# Patient Record
Sex: Female | Born: 1990 | Race: White | Hispanic: No | Marital: Single | Smoking: Never smoker
Health system: Southern US, Community
[De-identification: ages and names within clinical notes are randomized; demographics above are authoritative.]

## PROBLEM LIST (undated history)

## (undated) ENCOUNTER — Emergency Department (HOSPITAL_COMMUNITY): Payer: No Typology Code available for payment source | Source: Home / Self Care

## (undated) DIAGNOSIS — N750 Cyst of Bartholin's gland: Secondary | ICD-10-CM

## (undated) DIAGNOSIS — G43909 Migraine, unspecified, not intractable, without status migrainosus: Secondary | ICD-10-CM

## (undated) DIAGNOSIS — D649 Anemia, unspecified: Secondary | ICD-10-CM

---
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
## 2009-08-28 ENCOUNTER — Emergency Department (HOSPITAL_COMMUNITY): Admission: EM | Admit: 2009-08-28 | Discharge: 2009-08-28 | Payer: Self-pay | Admitting: Emergency Medicine

## 2009-12-01 ENCOUNTER — Inpatient Hospital Stay (HOSPITAL_COMMUNITY): Admission: AD | Admit: 2009-12-01 | Discharge: 2009-12-01 | Payer: Self-pay | Admitting: Obstetrics & Gynecology

## 2009-12-22 DEATH — deceased

## 2009-12-31 ENCOUNTER — Inpatient Hospital Stay (HOSPITAL_COMMUNITY): Admission: AD | Admit: 2009-12-31 | Discharge: 2009-12-31 | Payer: Self-pay | Admitting: Obstetrics

## 2010-08-04 ENCOUNTER — Emergency Department (HOSPITAL_COMMUNITY): Admission: EM | Admit: 2010-08-04 | Discharge: 2010-08-04 | Payer: Self-pay | Admitting: Family Medicine

## 2010-10-14 ENCOUNTER — Encounter: Payer: Self-pay | Admitting: Obstetrics & Gynecology

## 2010-12-12 LAB — WET PREP, GENITAL
Clue Cells Wet Prep HPF POC: NONE SEEN
Trich, Wet Prep: NONE SEEN
Yeast Wet Prep HPF POC: NONE SEEN

## 2010-12-12 LAB — CBC
HCT: 31.2 % — ABNORMAL LOW (ref 36.0–46.0)
Hemoglobin: 10.5 g/dL — ABNORMAL LOW (ref 12.0–15.0)
MCHC: 33.6 g/dL (ref 30.0–36.0)
MCV: 93.1 fL (ref 78.0–100.0)
RDW: 12.8 % (ref 11.5–15.5)

## 2010-12-12 LAB — SAMPLE TO BLOOD BANK

## 2010-12-12 LAB — POCT PREGNANCY, URINE: Preg Test, Ur: NEGATIVE

## 2010-12-17 LAB — CBC
HCT: 36 % (ref 36.0–46.0)
Hemoglobin: 12 g/dL (ref 12.0–15.0)
Platelets: 193 10*3/uL (ref 150–400)
WBC: 9.7 10*3/uL (ref 4.0–10.5)

## 2010-12-17 LAB — URINALYSIS, ROUTINE W REFLEX MICROSCOPIC
Bilirubin Urine: NEGATIVE
Glucose, UA: NEGATIVE mg/dL
Protein, ur: NEGATIVE mg/dL
Urobilinogen, UA: 0.2 mg/dL (ref 0.0–1.0)

## 2010-12-17 LAB — URINE MICROSCOPIC-ADD ON

## 2010-12-25 LAB — POCT RAPID STREP A (OFFICE): Streptococcus, Group A Screen (Direct): POSITIVE — AB

## 2011-05-04 ENCOUNTER — Inpatient Hospital Stay (INDEPENDENT_AMBULATORY_CARE_PROVIDER_SITE_OTHER)
Admission: RE | Admit: 2011-05-04 | Discharge: 2011-05-04 | Disposition: A | Discharge status: Home / Self Care | Payer: Managed Care, Other (non HMO) | Source: Ambulatory Visit | Attending: Family Medicine | Admitting: Family Medicine

## 2011-05-04 DIAGNOSIS — L259 Unspecified contact dermatitis, unspecified cause: Secondary | ICD-10-CM

## 2011-10-23 ENCOUNTER — Emergency Department (INDEPENDENT_AMBULATORY_CARE_PROVIDER_SITE_OTHER)
Admission: EM | Admit: 2011-10-23 | Discharge: 2011-10-23 | Disposition: A | Discharge status: Home / Self Care | Payer: Managed Care, Other (non HMO) | Source: Home / Self Care | Attending: Family Medicine | Admitting: Family Medicine

## 2011-10-23 ENCOUNTER — Encounter (HOSPITAL_COMMUNITY): Payer: Self-pay | Admitting: *Deleted

## 2011-10-23 DIAGNOSIS — J45901 Unspecified asthma with (acute) exacerbation: Secondary | ICD-10-CM

## 2011-10-23 DIAGNOSIS — J069 Acute upper respiratory infection, unspecified: Secondary | ICD-10-CM

## 2011-10-23 HISTORY — DX: Anemia, unspecified: D64.9

## 2011-10-23 LAB — POCT RAPID STREP A: Streptococcus, Group A Screen (Direct): NEGATIVE

## 2011-10-23 MED ORDER — ALBUTEROL SULFATE HFA 108 (90 BASE) MCG/ACT IN AERS: 1.0000 | INHALATION_SPRAY | Freq: Four times a day (QID) | RESPIRATORY_TRACT | Status: DC | PRN

## 2011-10-23 MED ORDER — ACETAMINOPHEN-CODEINE 120-12 MG/5ML PO SUSP: 5.0000 mL | Freq: Four times a day (QID) | ORAL | Status: AC | PRN

## 2011-10-23 MED ORDER — PREDNISONE 20 MG PO TABS: ORAL_TABLET | ORAL | Status: AC

## 2011-10-23 NOTE — ED Notes (Signed)
Pt with onset of cough congestion sorethroat and body aches Sunday night - per pt had same symptoms one week ago felt better then onset Sunday

## 2011-10-23 NOTE — ED Provider Notes (Signed)
History     CSN: [id number]  Arrival date & time 10/23/11  1814   First MD Initiated Contact with Patient 10/23/11 1919      Chief Complaint  Patient presents with  . Cough  . Nasal Congestion  . Sore Throat  . Generalized Body Aches    (Consider location/radiation/quality/duration/timing/severity/associated sxs/prior treatment) HPI Comments: 21 y/o female h/o asthma here c/o sore throat, cough non productive and nasal congestion also associated with body aches for 4 days. Subjective fever 2 days ago. Starting to feel better, no rash, no chest pain, shortness of breath or abdominal pain, no nausea vomiting or diarrhea. Has had wheezing with coughing episodes. Ran out of albuterol. Had cold symptoms 2 weeks ago that resolved for almost 1 week before starting again 4 days ago.    Past Medical History  Diagnosis Date  . Asthma   . Anemia     History reviewed. No pertinent past surgical history.  History reviewed. No pertinent family history.  History  Substance Use Topics  . Smoking status: Never Smoker   . Smokeless tobacco: Not on file  . Alcohol Use: Yes    OB History    Grav Para Term Preterm Abortions TAB SAB Ect Mult Living                  Review of Systems  Constitutional: Negative for fever and appetite change.  HENT: Positive for congestion, sore throat and rhinorrhea. Negative for trouble swallowing, neck pain and sinus pressure.   Respiratory: Positive for cough and wheezing. Negative for shortness of breath.   Cardiovascular: Negative for chest pain.  Gastrointestinal: Negative for nausea, vomiting, abdominal pain and diarrhea.  Musculoskeletal: Positive for myalgias.  Skin: Negative for rash.  Neurological: Negative for headaches.    Allergies  Review of patient's allergies indicates no known allergies.  Home Medications   Current Outpatient Rx  Name Route Sig Dispense Refill  . DEXTROMETHORPHAN POLISTIREX ER 30 MG/5ML PO LQCR Oral Take 60 mg  by mouth as needed.    . GUAIFENESIN-DM 100-10 MG/5ML PO SYRP Oral Take 5 mLs by mouth 3 (three) times daily as needed.    . ACETAMINOPHEN-CODEINE 120-12 MG/5ML PO SUSP Oral Take 5 mLs by mouth every 6 (six) hours as needed for pain. 60 mL 0  . ALBUTEROL SULFATE HFA 108 (90 BASE) MCG/ACT IN AERS Inhalation Inhale 1-2 puffs into the lungs every 6 (six) hours as needed for wheezing. 1 Inhaler 0  . PREDNISONE 20 MG PO TABS  2 tabs po daily for 5 days 10 tablet no    BP 111/77  Pulse 75  Temp(Src) 98.7 F (37.1 C) (Oral)  Resp 16  SpO2 99%  LMP 09/22/2011  Physical Exam  Nursing note and vitals reviewed. Constitutional: She is oriented to person, place, and time. She appears well-developed and well-nourished. No distress.  HENT:  Head: Normocephalic and atraumatic.       Nasal Congestion with erythema and swelling of nasal turbinates, clear rhinorrhea. Mild pharyngeal erythema no exudates. No uvula deviation. No trismus. TM's clear fluid behind, no swelling redness or bulging.   Cardiovascular: Normal rate, regular rhythm and normal heart sounds.   Pulmonary/Chest: No respiratory distress. She has no wheezes. She has no rales. She exhibits no tenderness.       Mild prolonged expiration with sporadic rhonchi bilaterally, no active wheezing. No retractions, orthopnea or tachypnea.  Lymphadenopathy:    She has no cervical adenopathy.  Neurological: She is  alert and oriented to person, place, and time.  Skin: No rash noted.    ED Course  Procedures (including critical care time)   Labs Reviewed  POCT RAPID STREP A ([facility] CARE ONLY)  LAB REPORT - SCANNED   No results found.   1. URI (upper respiratory infection)   2. Asthma exacerbation       MDM  Negative rapid strept test. Treated with prednisone, albuterol, codeine will also take Claritin-D as needed.        Sharin Grave, MD 10/24/11 1515

## 2012-02-25 ENCOUNTER — Encounter (HOSPITAL_COMMUNITY): Payer: Self-pay | Admitting: Emergency Medicine

## 2012-02-25 ENCOUNTER — Emergency Department (INDEPENDENT_AMBULATORY_CARE_PROVIDER_SITE_OTHER)
Admission: EM | Admit: 2012-02-25 | Discharge: 2012-02-25 | Disposition: A | Discharge status: Home / Self Care | Payer: Managed Care, Other (non HMO) | Source: Home / Self Care | Attending: Emergency Medicine | Admitting: Emergency Medicine

## 2012-02-25 DIAGNOSIS — H60399 Other infective otitis externa, unspecified ear: Secondary | ICD-10-CM

## 2012-02-25 DIAGNOSIS — H6090 Unspecified otitis externa, unspecified ear: Secondary | ICD-10-CM

## 2012-02-25 MED ORDER — CEPHALEXIN 500 MG PO CAPS: 500.0000 mg | ORAL_CAPSULE | Freq: Three times a day (TID) | ORAL | Status: AC

## 2012-02-25 NOTE — Discharge Instructions (Signed)
Otitis Externa  Otitis externa ("swimmer's ear") is a germ (bacterial) or fungal infection of the outer ear canal (from the eardrum to the outside of the ear). Swimming in dirty water may cause swimmer's ear. It also may be caused by moisture in the ear from water remaining after swimming or bathing. Often the first signs of infection may be itching in the ear canal. This may progress to ear canal swelling, redness, and pus drainage, which may be signs of infection.  HOME CARE INSTRUCTIONS    Apply the antibiotic drops to the ear canal as prescribed by your doctor.   This can be a very painful medical condition. A strong pain reliever may be prescribed.   Only take over-the-counter or prescription medicines for pain, discomfort, or fever as directed by your caregiver.   If your caregiver has given you a follow-up appointment, it is very important to keep that appointment. Not keeping the appointment could result in a chronic or permanent injury, pain, hearing loss and disability. If there is any problem keeping the appointment, you must call back to this facility for assistance.  PREVENTION    It is important to keep your ear dry. Use the corner of a towel to wick water out of the ear canal after swimming or bathing.   Avoid scratching in your ear. This can damage the ear canal or remove the protective wax lining the canal and make it easier for germs (bacteria) or a fungus to grow.   You may use ear drops made of rubbing alcohol and vinegar after swimming to prevent future "swimmer's ear" infections. Make up a small bottle of equal parts white vinegar and alcohol. Put 3 or 4 drops into each ear after swimming.   Avoid swimming in lakes, polluted water, or poorly chlorinated pools.  SEEK MEDICAL CARE IF:    An oral temperature above 102 F (38.9 C) develops.   Your ear is still painful after 3 days and shows signs of getting worse (redness, swelling, pain, or pus).  MAKE SURE YOU:    Understand these  instructions.   Will watch your condition.   Will get help right away if you are not doing well or get worse.  Document Released: 09/09/2005 Document Revised: 08/29/2011 Document Reviewed: 04/15/2008  [employer] Patient Information 2012 [employer].

## 2012-02-25 NOTE — ED Notes (Signed)
C/o right earache and headache.  Headache started Friday.  Headache went away yesterday and came back today.  C/o facial pain tenderness.  Ear pain started last night and has worsened today

## 2012-02-25 NOTE — ED Provider Notes (Signed)
Chief Complaint  Patient presents with  . Otalgia    History of Present Illness:   Mindy Rios is a 21 year old female who has had a five-day history of right external ear pain. She's also had some headache, congestion, and rhinorrhea. She denies any fever, chills, sore throat, or cough. The pain is located on the external ear there is some redness and swelling and is tender to touch. She denies any drainage from the ear.  Review of Systems:  Other than noted above, the patient denies any of the following symptoms. Systemic:  No fever, chills, sweats, fatigue, myalgias, headache, or anorexia. Eye:  No redness, pain or drainage. ENT:  No earache, ear congestion, nasal congestion, sneezing, rhinorrhea, sinus pressure, sinus pain, post nasal drip, or sore throat. Lungs:  No cough, sputum production, wheezing, shortness of breath, or chest pain. GI:  No abdominal pain, nausea, vomiting, or diarrhea. Skin:  No rash or itching.  PMFSH:  Past medical history, family history, social history, meds, and allergies were reviewed.  Physical Exam:   Vital signs:  BP 102/70  Pulse 62  Temp(Src) 98.9 F (37.2 C) (Oral)  Resp 18  SpO2 99%  LMP 02/18/2012 General:  Alert, in no distress. Eye:  No conjunctival injection or drainage. Lids were normal. ENT:  TMs and canals were normal, without erythema or inflammation. She had 2 areas of erythema on the external ear, one on the pinna and one on the helix of the ear. These were tender to touch. The remainder the external ear appeared normal in the canal and TM are unremarkable. Nasal mucosa was clear and uncongested, without drainage.  Mucous membranes were moist.  Pharynx was clear, without exudate or drainage.  There were no oral ulcerations or lesions. Neck:  Supple, no adenopathy, tenderness or mass. Lungs:  No respiratory distress.  Lungs were clear to auscultation, without wheezes, rales or rhonchi.  Breath sounds were clear and equal bilaterally. Lungs were  resonant to percussion.  No egophony. Heart:  Regular rhythm, without gallops, murmers or rubs. Skin:  Clear, warm, and dry, without rash or lesions.  Assessment:  The encounter diagnosis was Otitis externa. This appears to be a skin infection of the external ear. Additionally, this may be the cause for the headache as well.  Plan:   1.  The following meds were prescribed:   New Prescriptions   CEPHALEXIN (KEFLEX) 500 MG CAPSULE    Take 1 capsule (500 mg total) by mouth 3 (three) times daily.   2.  The patient was instructed in symptomatic care and handouts were given. 3.  The patient was told to return if becoming worse in any way, if no better in 3 or 4 days, and given some red flag symptoms that would indicate earlier return.   Reuben Likes, MD 02/25/12 2139

## 2012-09-04 ENCOUNTER — Encounter (HOSPITAL_COMMUNITY): Payer: Self-pay | Admitting: Emergency Medicine

## 2012-09-04 ENCOUNTER — Emergency Department (HOSPITAL_COMMUNITY)
Admission: EM | Admit: 2012-09-04 | Discharge: 2012-09-05 | Disposition: A | Discharge status: Home / Self Care | Payer: Managed Care, Other (non HMO) | Attending: Emergency Medicine | Admitting: Emergency Medicine

## 2012-09-04 ENCOUNTER — Emergency Department (HOSPITAL_COMMUNITY): Payer: Managed Care, Other (non HMO)

## 2012-09-04 DIAGNOSIS — J029 Acute pharyngitis, unspecified: Secondary | ICD-10-CM | POA: Insufficient documentation

## 2012-09-04 DIAGNOSIS — Z862 Personal history of diseases of the blood and blood-forming organs and certain disorders involving the immune mechanism: Secondary | ICD-10-CM | POA: Insufficient documentation

## 2012-09-04 DIAGNOSIS — R05 Cough: Secondary | ICD-10-CM | POA: Insufficient documentation

## 2012-09-04 DIAGNOSIS — J40 Bronchitis, not specified as acute or chronic: Secondary | ICD-10-CM | POA: Insufficient documentation

## 2012-09-04 DIAGNOSIS — G43909 Migraine, unspecified, not intractable, without status migrainosus: Secondary | ICD-10-CM | POA: Insufficient documentation

## 2012-09-04 DIAGNOSIS — R059 Cough, unspecified: Secondary | ICD-10-CM | POA: Insufficient documentation

## 2012-09-04 DIAGNOSIS — R071 Chest pain on breathing: Secondary | ICD-10-CM | POA: Insufficient documentation

## 2012-09-04 DIAGNOSIS — J45909 Unspecified asthma, uncomplicated: Secondary | ICD-10-CM | POA: Insufficient documentation

## 2012-09-04 MED ORDER — KETOROLAC TROMETHAMINE 60 MG/2ML IM SOLN
60.0000 mg | Freq: Once | INTRAMUSCULAR | Status: AC
  Administered 2012-09-04: 60 mg via INTRAMUSCULAR
  Filled 2012-09-04: qty 2

## 2012-09-04 MED ORDER — PREDNISONE 20 MG PO TABS
60.0000 mg | ORAL_TABLET | Freq: Once | ORAL | Status: AC
  Administered 2012-09-04: 60 mg via ORAL
  Filled 2012-09-04: qty 3
  Filled 2012-09-04: qty 1

## 2012-09-04 MED ORDER — ALBUTEROL SULFATE (5 MG/ML) 0.5% IN NEBU
5.0000 mg | INHALATION_SOLUTION | Freq: Once | RESPIRATORY_TRACT | Status: AC
  Administered 2012-09-04: 5 mg via RESPIRATORY_TRACT
  Filled 2012-09-04 (×2): qty 1

## 2012-09-04 MED ORDER — IPRATROPIUM BROMIDE 0.02 % IN SOLN
0.5000 mg | Freq: Once | RESPIRATORY_TRACT | Status: AC
  Administered 2012-09-04: 0.5 mg via RESPIRATORY_TRACT
  Filled 2012-09-04 (×2): qty 2.5

## 2012-09-04 NOTE — ED Notes (Signed)
Called pt in waiting room with  No response

## 2012-09-04 NOTE — ED Notes (Addendum)
Pt c/o SOB starting today; pt sounds hoarse at present; pt sts pain with cough and inspiration

## 2012-09-04 NOTE — ED Notes (Signed)
PT states no relief from breathing tx she received in triage.

## 2012-09-04 NOTE — ED Provider Notes (Signed)
History     CSN: [id number]  Arrival date & time 09/04/12  4098   First MD Initiated Contact with Patient 09/04/12 2301      Chief Complaint  Patient presents with  . Shortness of Breath    (Consider location/radiation/quality/duration/timing/severity/associated sxs/prior treatment) HPI History provided by pt.   Pt woke this morning w/ sore throat and cough.  At approx 11am, she had gradual onset non-exertional SOB and mildly pleuritic mid-line chest pain.  Has spasm-like pain in left mid-back as well.  H/o asthma.  Had a breathing treatment in triage which did not relieve her sx and she developed a typical frontal migraine headache shortly after.  Associated w/ photophobia; denies dizziness, blurred vision, N/V.  No recent head trauma.  Denies fever, nasal congestion, rhinorrhea, N/V/D and urinary sx.  Mother being treated for bronchitis currently.  No RF for PE.  Past Medical History  Diagnosis Date  . Asthma   . Anemia     History reviewed. No pertinent past surgical history.  History reviewed. No pertinent family history.  History  Substance Use Topics  . Smoking status: Never Smoker   . Smokeless tobacco: Not on file  . Alcohol Use: Yes    OB History    Grav Para Term Preterm Abortions TAB SAB Ect Mult Living                  Review of Systems  All other systems reviewed and are negative.    Allergies  Review of patient's allergies indicates no known allergies.  Home Medications  No current outpatient prescriptions on file.  BP 142/75  Pulse 88  Temp 98.6 F (37 C) (Oral)  SpO2 100%  LMP 08/07/2012  Physical Exam  Nursing note and vitals reviewed. Constitutional: She is oriented to person, place, and time. She appears well-developed and well-nourished. No distress.  HENT:  Head: Normocephalic and atraumatic.       Mild erythema posterior pharynx.  No tonsillar edema or exudate.  Mild, bilateral frontal and maxillary sinus ttp.  Nml TMs and external  auditory canals.   Eyes:       Normal appearance  Neck: Normal range of motion.       No meningeal signs  Cardiovascular: Normal rate, regular rhythm and intact distal pulses.   Pulmonary/Chest: Effort normal and breath sounds normal.       Diffuse anterior chest tenderness w/ guarding.  No pleuritic pain reported.   Abdominal: Soft. Bowel sounds are normal. She exhibits no distension and no mass. There is no rebound and no guarding.       Mild LUQ ttp.    Genitourinary:       Mild L CVA ttp  Musculoskeletal: Normal range of motion.       No LE edema or ttp.  Mild tenderness left mid-back, including but less prominent at CVA .   Lymphadenopathy:    She has no cervical adenopathy.  Neurological: She is alert and oriented to person, place, and time. No sensory deficit. Coordination normal.       CN 3-12 intact.  No nystagmus.  5/5 and equal upper and lower extremity strength.  No past pointing.    Skin: Skin is warm and dry. No rash noted.  Psychiatric: She has a normal mood and affect. Her behavior is normal.    ED Course  Procedures (including critical care time)   Date: 09/04/2012  Rate: 97  Rhythm: normal sinus rhythm  QRS Axis:  normal  Intervals: normal  ST/T Wave abnormalities: normal  Conduction Disutrbances:none  Narrative Interpretation:   Old EKG Reviewed: none available   Labs Reviewed - No data to display Dg Chest 2 View  09/04/2012  *RADIOLOGY REPORT*  Clinical Data: Shortness of breath, mid chest pain radiating to back, dry cough, history asthma  CHEST - 2 VIEW  Comparison: None  Findings: Normal heart size, mediastinal contours, and pulmonary vascularity. Lungs clear. Bones unremarkable. No pneumothorax.  IMPRESSION: Normal exam.   Original Report Authenticated By: Ulyses Southward, M.D.      1. Bronchitis   2. Migraine       MDM  21yo F w/ h/o asthma and migraines presents w/ c/o cough, non-exertional CP/SOB, sore throat and migraine.  Attributes migraine to  breathing treatment administered in triage and characteristics of pain are typical.  On exam, afebrile, VS w/in nml range,  no respiratory distress, nml breath sounds, unremarkable ENT, no signs of DVT, no focal neuro deficits or meningeal signs.  CXR neg.  EKG pending.  Pt to receive IM toradol for headache and chest/throat pain as well as single dose of prednisone.  11:25 PM   EKG non-ischemic/NSR.  Pt reports improvement in headache and throat/CP.  She was ambulated by nursing staff and did not become symptomatic nor drop her sats.  D/c'd home w/ guaifenesin-codeine for symptomatic treatment of bronchitis.  She has h/o allergic rxn to unknown bronchodilator.  Return precautions discussed. 12:37 AM        Otilio Miu, PA-C 09/05/12 [phone]

## 2012-09-05 MED ORDER — GUAIFENESIN-CODEINE 100-10 MG/5ML PO SYRP: 5.0000 mL | ORAL_SOLUTION | Freq: Three times a day (TID) | ORAL | Status: DC | PRN

## 2012-09-05 NOTE — ED Provider Notes (Signed)
Medical screening examination/treatment/procedure(s) were performed by non-physician practitioner and as supervising physician I was immediately available for consultation/collaboration.    Vida Roller, MD 09/05/12 [phone]

## 2012-09-05 NOTE — ED Notes (Signed)
Pt was able to ambulate and maintain sats of 98% on room air.

## 2013-05-06 ENCOUNTER — Encounter (HOSPITAL_COMMUNITY): Payer: Self-pay | Admitting: Emergency Medicine

## 2013-05-06 ENCOUNTER — Emergency Department (HOSPITAL_COMMUNITY)
Admission: EM | Admit: 2013-05-06 | Discharge: 2013-05-06 | Disposition: A | Discharge status: Home / Self Care | Payer: Self-pay | Attending: Emergency Medicine | Admitting: Emergency Medicine

## 2013-05-06 DIAGNOSIS — H1030 Unspecified acute conjunctivitis, unspecified eye: Secondary | ICD-10-CM | POA: Insufficient documentation

## 2013-05-06 DIAGNOSIS — H109 Unspecified conjunctivitis: Secondary | ICD-10-CM

## 2013-05-06 DIAGNOSIS — J45909 Unspecified asthma, uncomplicated: Secondary | ICD-10-CM | POA: Insufficient documentation

## 2013-05-06 MED ORDER — ERYTHROMYCIN 5 MG/GM OP OINT: TOPICAL_OINTMENT | OPHTHALMIC | Status: DC

## 2013-05-06 NOTE — ED Provider Notes (Signed)
CSN: [id number]     Arrival date & time 05/06/13  4098 History     First MD Initiated Contact with Patient 05/06/13 [phone]     Chief Complaint  Patient presents with  . Eye Pain   (Consider location/radiation/quality/duration/timing/severity/associated sxs/prior Treatment) HPI Comments: Patient is a 22 year old female who presents today with injection of her right conjunctiva last night. She states that her boyfriend was recently diagnosed with conjunctivitis. Her right eye is itchy and she noted purulent discharge from her eye. She states that she had to use a warm wash cloth to wipe her eye off to open her eyes. She reports no other symptoms including fever, chills, nausea, vomiting, cough, cold, congestion, photophobia, double vision, blurry vision. She wears glasses, but not contacts.  The history is provided by the patient. No language interpreter was used.    Past Medical History  Diagnosis Date  . Asthma   . Anemia    History reviewed. No pertinent past surgical history. No family history on file. History  Substance Use Topics  . Smoking status: Never Smoker   . Smokeless tobacco: Not on file  . Alcohol Use: Yes   OB History   Grav Para Term Preterm Abortions TAB SAB Ect Mult Living                 Review of Systems  Constitutional: Negative for fever and chills.  HENT: Negative for congestion, sore throat and rhinorrhea.   Eyes: Positive for discharge, redness and itching. Negative for photophobia, pain and visual disturbance.  Respiratory: Negative for cough and shortness of breath.   Cardiovascular: Negative for chest pain.  All other systems reviewed and are negative.    Allergies  Review of patient's allergies indicates no known allergies.  Home Medications   Current Outpatient Rx  Name  Route  Sig  Dispense  Refill  . guaiFENesin-codeine (ROBITUSSIN AC) 100-10 MG/5ML syrup   Oral   Take 5 mLs by mouth 3 (three) times daily as needed for cough.   60  mL   0    BP 107/77  Pulse 66  Temp(Src) 98.4 F (36.9 C) (Oral)  Resp 18  Ht 5\' 5"  (1.651 m)  Wt 162 lb (73.483 kg)  BMI 26.96 kg/m2  SpO2 99%  LMP 04/15/2013 Physical Exam  Nursing note and vitals reviewed. Constitutional: She is oriented to person, place, and time. She appears well-developed and well-nourished. No distress.  HENT:  Head: Normocephalic and atraumatic.  Right Ear: External ear normal.  Left Ear: External ear normal.  Nose: Nose normal.  Mouth/Throat: Oropharynx is clear and moist.  Eyes: EOM are normal. Pupils are equal, round, and reactive to light. Right conjunctiva is injected. Right conjunctiva has no hemorrhage. Left conjunctiva is not injected. Left conjunctiva has no hemorrhage.  Mild injection of right conjunctiva  No entrapment or purulent discharge noted. Mild watery discharge. No photophobia or photophobia with consensual light reflex. No swelling.  Neck: Normal range of motion.  Cardiovascular: Normal rate, regular rhythm and normal heart sounds.   Pulmonary/Chest: Effort normal and breath sounds normal. No stridor. No respiratory distress. She has no wheezes. She has no rales.  Abdominal: Soft. She exhibits no distension.  Musculoskeletal: Normal range of motion.  Neurological: She is alert and oriented to person, place, and time. She has normal strength.  Skin: Skin is warm and dry. She is not diaphoretic. No erythema.  Psychiatric: She has a normal mood and affect. Her behavior is normal.  ED Course   Procedures (including critical care time)  Labs Reviewed - No data to display No results found. 1. Conjunctivitis     MDM  Patient presentation consistent with viral conjunctivitis.  No purulent discharge, corneal abrasions, entrapment, consensual photophobia.  Presentation non-concerning for iritis, bacterial conjunctivitis, corneal abrasions, or HSV. Patient was given rx for erythromycin ointment and told to only fill the rx if  symptoms did not improve within 48 hours.  Personal hygiene and frequent handwashing discussed.  Patient advised to return to ED if symptoms persist or worsen in any way including vision change or purulent discharge.  Patient verbalizes understanding and is agreeable with discharge.   Mora Bellman, PA-C 05/06/13 [phone]

## 2013-05-06 NOTE — ED Notes (Signed)
Patient states that her boyfriend was diagnosed with pink eye yesterday.  Patient states her eye itches, is red and has drainage.

## 2013-05-06 NOTE — ED Provider Notes (Signed)
Medical screening examination/treatment/procedure(s) were performed by non-physician practitioner and as supervising physician I was immediately available for consultation/collaboration.  Olivia Mackie, MD 05/06/13 [phone]

## 2013-06-04 IMAGING — CR DG CHEST 2V
2 series · 2 of 2 positions shown · non-contrast
Comparison: None

CLINICAL DATA: Shortness of breath, mid chest pain radiating to
back, dry cough, history asthma

CHEST - 2 VIEW

[w chest pa]
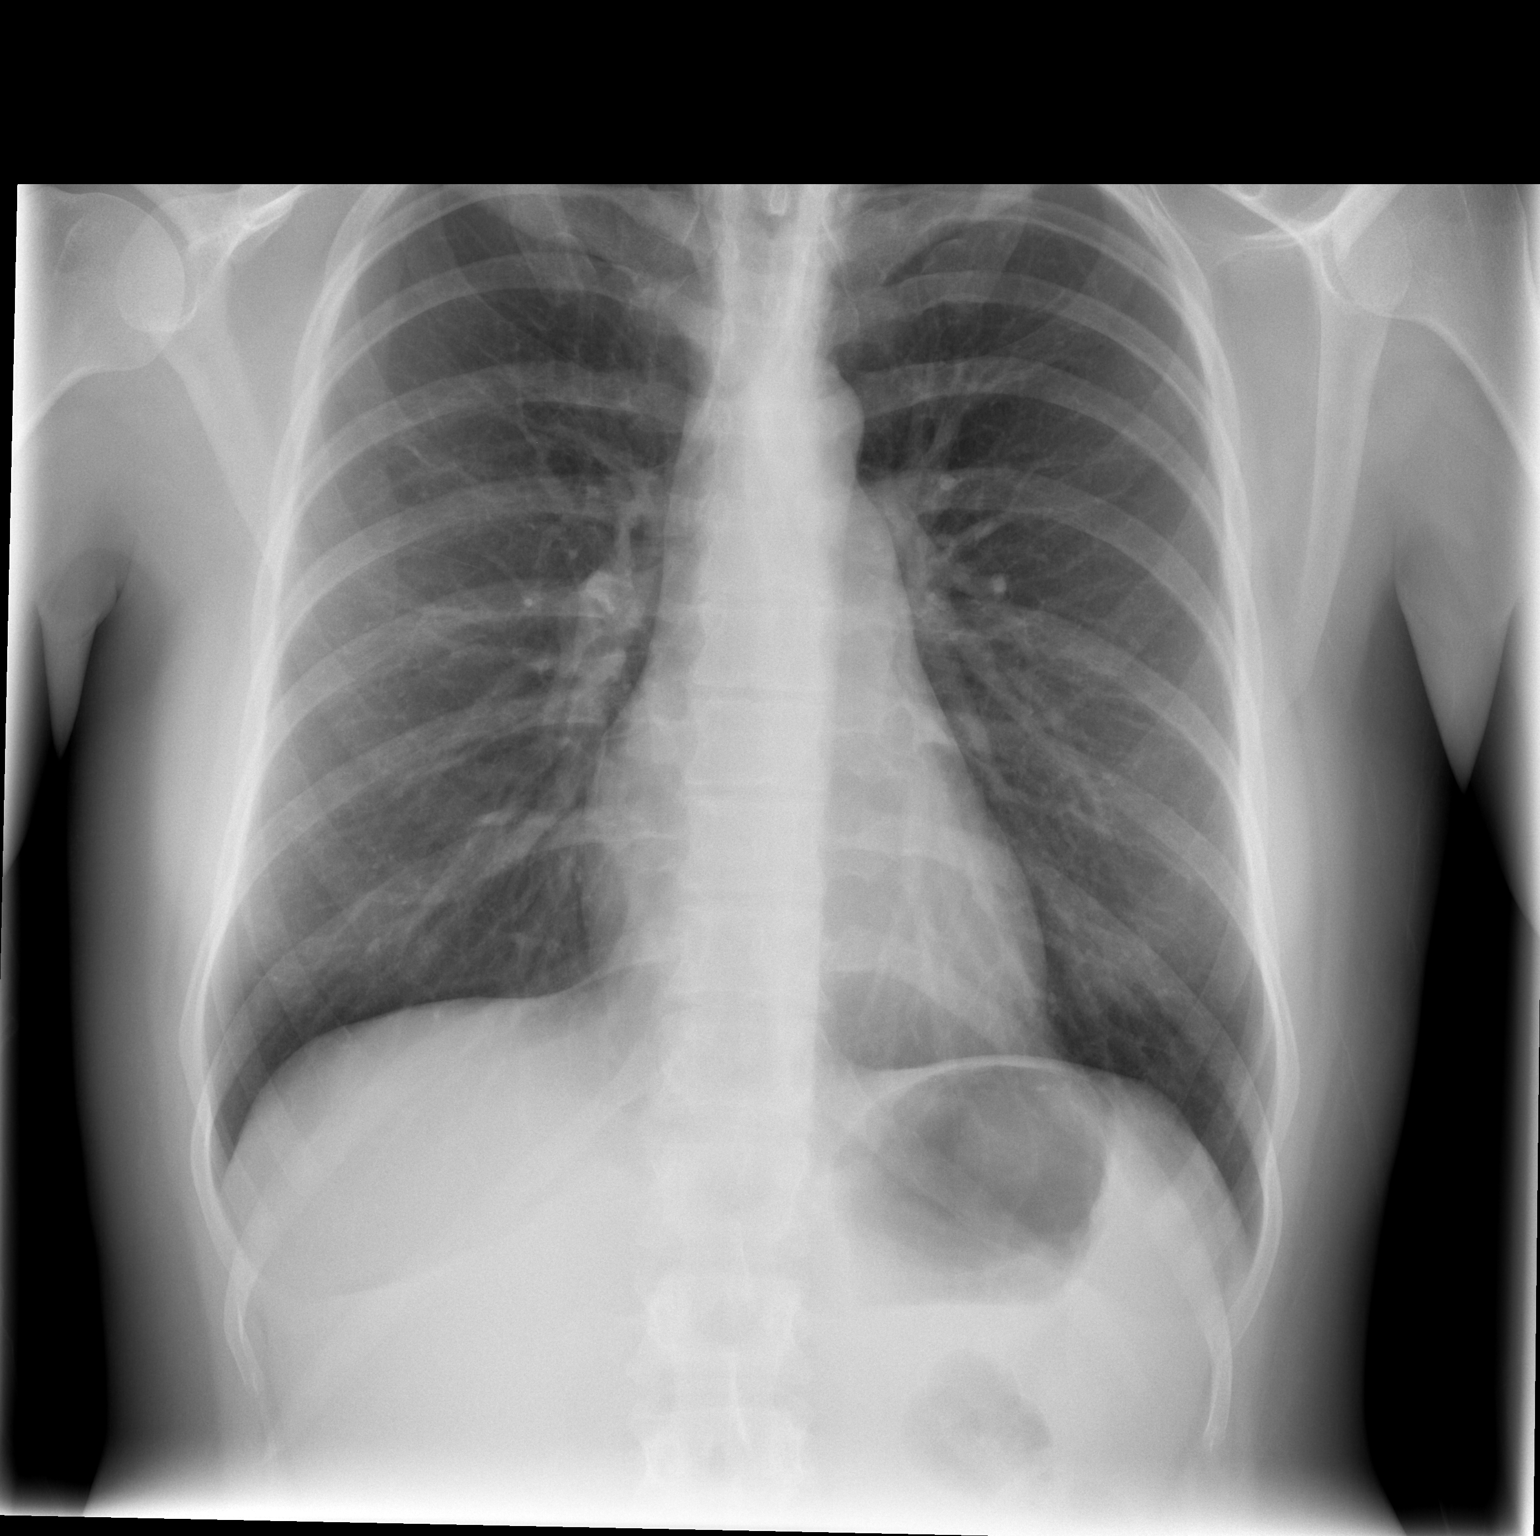

[w chest lat]
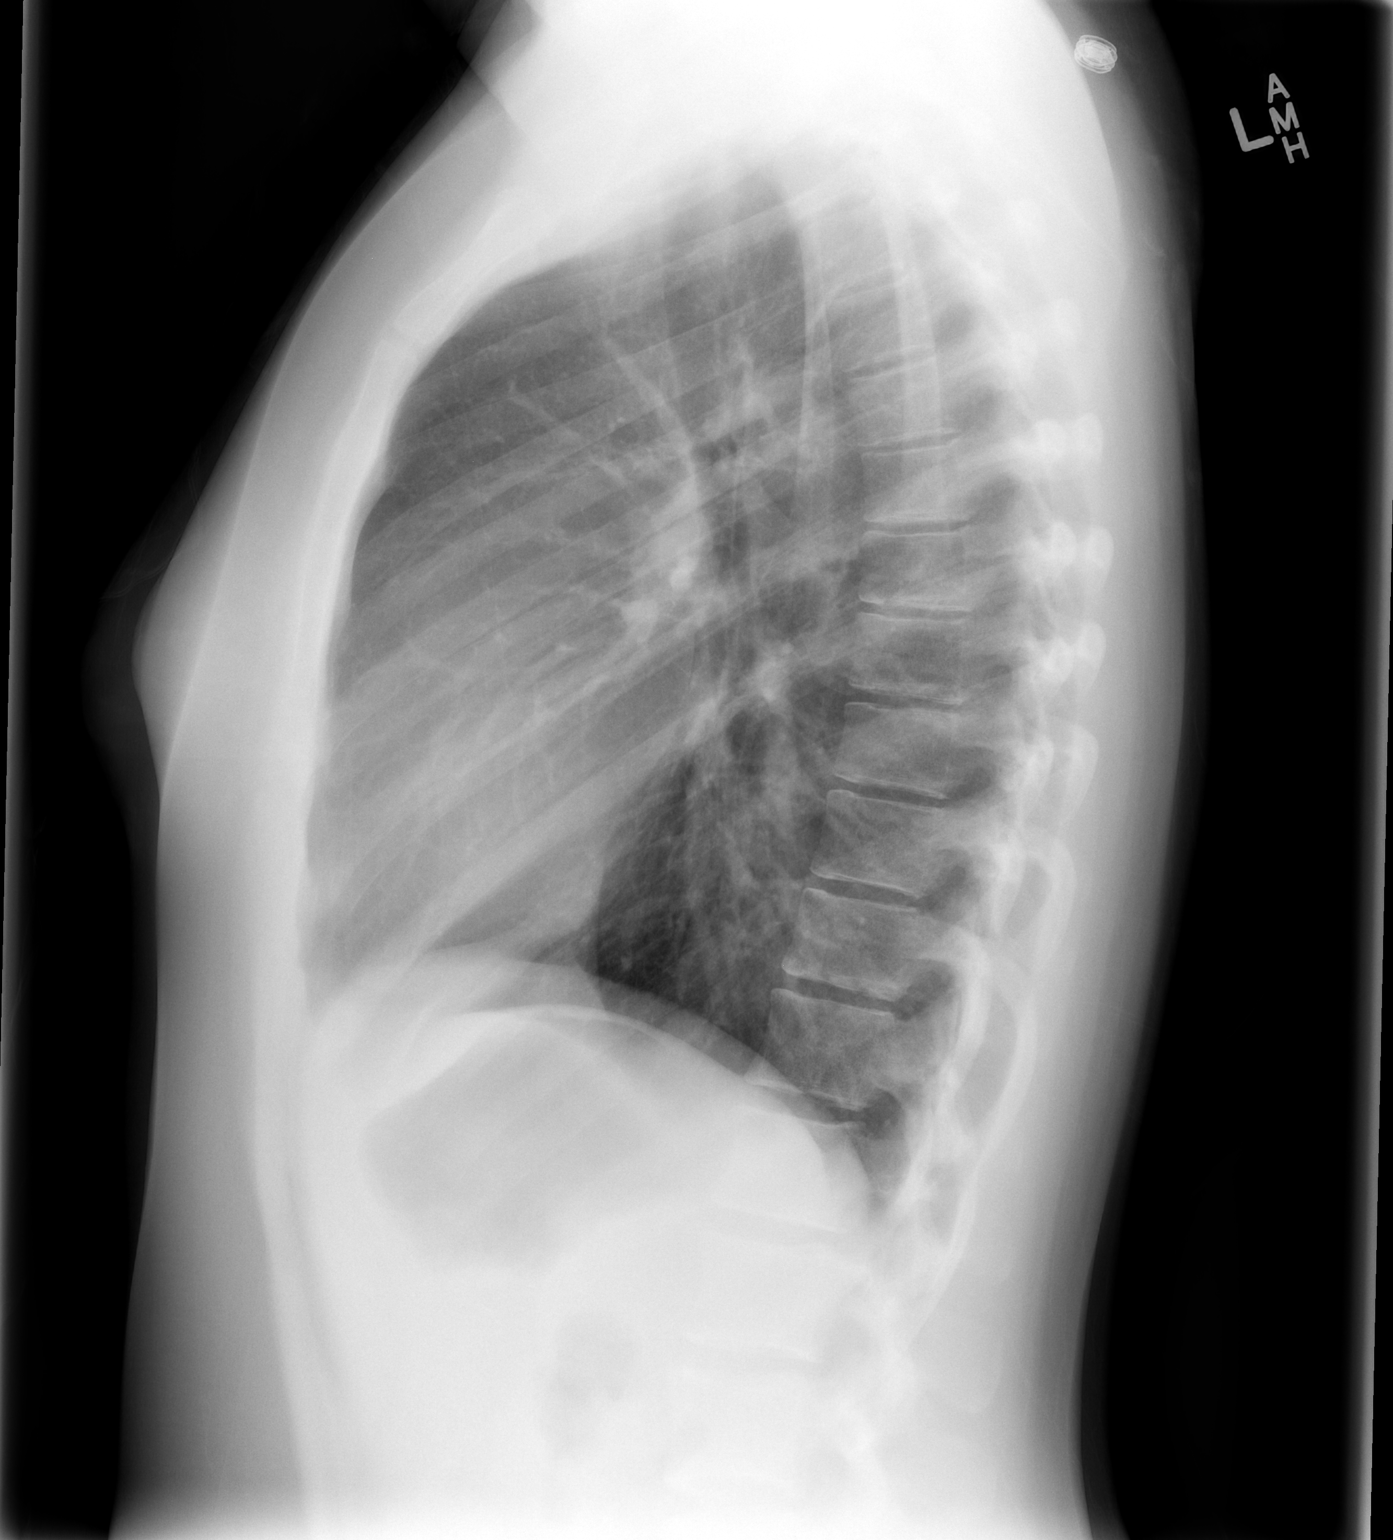

[2 of 2 positions shown; findings below may reference images not displayed]

FINDINGS: Normal heart size, mediastinal contours, and pulmonary vascularity.
Lungs clear.
Bones unremarkable.
No pneumothorax.
IMPRESSION: Normal exam.

## 2013-06-23 ENCOUNTER — Emergency Department (HOSPITAL_COMMUNITY)
Admission: EM | Admit: 2013-06-23 | Discharge: 2013-06-23 | Disposition: A | Discharge status: Home / Self Care | Payer: Managed Care, Other (non HMO) | Attending: Emergency Medicine | Admitting: Emergency Medicine

## 2013-06-23 ENCOUNTER — Encounter (HOSPITAL_COMMUNITY): Payer: Self-pay | Admitting: *Deleted

## 2013-06-23 DIAGNOSIS — J029 Acute pharyngitis, unspecified: Secondary | ICD-10-CM | POA: Insufficient documentation

## 2013-06-23 DIAGNOSIS — J45909 Unspecified asthma, uncomplicated: Secondary | ICD-10-CM | POA: Insufficient documentation

## 2013-06-23 DIAGNOSIS — Z79899 Other long term (current) drug therapy: Secondary | ICD-10-CM | POA: Insufficient documentation

## 2013-06-23 DIAGNOSIS — H9209 Otalgia, unspecified ear: Secondary | ICD-10-CM | POA: Insufficient documentation

## 2013-06-23 DIAGNOSIS — J069 Acute upper respiratory infection, unspecified: Secondary | ICD-10-CM

## 2013-06-23 DIAGNOSIS — G43909 Migraine, unspecified, not intractable, without status migrainosus: Secondary | ICD-10-CM | POA: Insufficient documentation

## 2013-06-23 HISTORY — DX: Migraine, unspecified, not intractable, without status migrainosus: G43.909

## 2013-06-23 MED ORDER — ACETAMINOPHEN 500 MG PO TABS
1000.0000 mg | ORAL_TABLET | Freq: Once | ORAL | Status: AC
  Administered 2013-06-23: 1000 mg via ORAL
  Filled 2013-06-23: qty 2

## 2013-06-23 NOTE — ED Notes (Addendum)
Reports history of migraines, has had this kind of migraine before. Has been taking excedrine migraine without relief. C/o non prod cough, bilateral ear ache no drainage & facial pain due to sinus pressure since yesterday. Denies fever, chills, n/v. Pt drove self here from work

## 2013-06-23 NOTE — ED Provider Notes (Signed)
CSN: [id number]     Arrival date & time 06/23/13  0803 History   First MD Initiated Contact with Patient 06/23/13 0830     Chief Complaint  Patient presents with  . Migraine  . Otalgia  . Sore Throat   (Consider location/radiation/quality/duration/timing/severity/associated sxs/prior Treatment) HPI Comments: 22 year old female presents with one week of waxing and waning headache consistent with her prior migraines. The headache is in the front and seems to come and go. She also now has 24 hours of upper respiratory symptoms. The symptoms include bilateral earache, nasal congestion, rhinorrhea, sneezing, and dry cough. She also has had a scratchy throat. She denies any neck pain or neck stiffness. Denies any fevers. She denies any dyspnea or chest pain. She's not had any photophobia, blurry vision, or nausea or vomiting. She has tried Excedrin at home, most recently last night, without much relief.  The history is provided by the patient.    Past Medical History  Diagnosis Date  . Asthma   . Anemia   . Migraine    History reviewed. No pertinent past surgical history. No family history on file. History  Substance Use Topics  . Smoking status: Never Smoker   . Smokeless tobacco: Not on file  . Alcohol Use: Yes   OB History   Grav Para Term Preterm Abortions TAB SAB Ect Mult Living                 Review of Systems  Constitutional: Negative for fever.  HENT: Positive for ear pain, congestion, sore throat, rhinorrhea and sneezing.   Eyes: Negative for photophobia and visual disturbance.  Respiratory: Positive for cough. Negative for shortness of breath.   Cardiovascular: Negative for chest pain.  Gastrointestinal: Negative for nausea and vomiting.  Neurological: Positive for headaches. Negative for weakness and numbness.  All other systems reviewed and are negative.    Allergies  Review of patient's allergies indicates no known allergies.  Home Medications   Current  Outpatient Rx  Name  Route  Sig  Dispense  Refill  . albuterol (PROVENTIL HFA;VENTOLIN HFA) 108 (90 BASE) MCG/ACT inhaler   Inhalation   Inhale 2 puffs into the lungs every 6 (six) hours as needed for wheezing.         Marland Kitchen aspirin-acetaminophen-caffeine (EXCEDRIN MIGRAINE) 250-250-65 MG per tablet   Oral   Take 2 tablets by mouth every 6 (six) hours as needed for pain.         . Multiple Vitamin (MULTIVITAMIN WITH MINERALS) TABS tablet   Oral   Take 1 tablet by mouth daily.          BP 117/80  Pulse 76  Temp(Src) 98.2 F (36.8 C) (Oral)  Resp 18  Ht 5\' 5"  (1.651 m)  Wt 152 lb (68.947 kg)  BMI 25.29 kg/m2  SpO2 100%  LMP 06/19/2013 Physical Exam  Nursing note and vitals reviewed. Constitutional: She is oriented to person, place, and time. She appears well-developed and well-nourished. No distress.  HENT:  Head: Normocephalic and atraumatic.  Right Ear: External ear normal.  Left Ear: External ear normal.  Nose: Nose normal.  Mouth/Throat: No oropharyngeal exudate.  Eyes: EOM are normal. Pupils are equal, round, and reactive to light. Right eye exhibits no discharge. Left eye exhibits no discharge.  Cardiovascular: Normal rate, regular rhythm and normal heart sounds.   Pulmonary/Chest: Effort normal and breath sounds normal. She has no wheezes. She has no rales.  Abdominal: Soft. There is no tenderness.  Neurological: She is alert and oriented to person, place, and time. She has normal strength. No cranial nerve deficit or sensory deficit. GCS eye subscore is 4. GCS verbal subscore is 5. GCS motor subscore is 6.  Skin: Skin is warm and dry.    ED Course  Procedures (including critical care time) Labs Review Labs Reviewed - No data to display Imaging Review No results found.  MDM   1. Migraine   2. Upper respiratory infection    Patient's headache appears to be benign. She has a normal neuro exam and this is similar to her many prior headaches. She also has  upper respiratory symptoms consistent with a viral URI. By [facility] criteria she's not need stress testing or treatment. I offered IV treatment for her headache but she prefers to take his Tylenol and treatment at home. I discussed return precautions which she verbalized understanding of.   Audree Camel, MD 06/23/13 [phone]

## 2013-06-23 NOTE — ED Notes (Signed)
C/o migraine x 1 week & sore throat, ear ache & nasal congestion since yesterday

## 2013-12-13 ENCOUNTER — Encounter (HOSPITAL_COMMUNITY): Payer: Self-pay | Admitting: Emergency Medicine

## 2013-12-13 ENCOUNTER — Emergency Department (INDEPENDENT_AMBULATORY_CARE_PROVIDER_SITE_OTHER)
Admission: EM | Admit: 2013-12-13 | Discharge: 2013-12-13 | Disposition: A | Discharge status: Home / Self Care | Payer: No Typology Code available for payment source | Source: Home / Self Care | Attending: Family Medicine | Admitting: Family Medicine

## 2013-12-13 DIAGNOSIS — R202 Paresthesia of skin: Secondary | ICD-10-CM

## 2013-12-13 DIAGNOSIS — R51 Headache: Secondary | ICD-10-CM

## 2013-12-13 NOTE — Discharge Instructions (Signed)
Please use tylenol or ibuprofen as directed on packaging for discomfort. exam does not suggest intracranial process, such as stroke. Suggested that you monitor symptoms closely at home and if symptoms become persistent, suddenly worse or severe, you should have yourself re-evaluated at nearest emergency room. If symptoms persist,  follow up with  PCP for further evaluation.

## 2013-12-13 NOTE — ED Provider Notes (Signed)
CSN: [id number]     Arrival date & time 12/13/13  [id number] History   First MD Initiated Contact with Patient 12/13/13 1011     Chief Complaint  Patient presents with  . Extremity Weakness   (Consider location/radiation/quality/duration/timing/severity/associated sxs/prior Treatment) HPI Comments: Patient states that while she was getting ready for work this morning she developed a "tingling" discomfort at her left upper extremity from left shoulder to left hand. Describes discomfort as a throbbing sensation that waxes and wanes. Denies any changes in strength, recent illness or recent injury. 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 Spends much of her day on phone while typing at keyboard.   Patient is a 23 y.o. female presenting with extremity weakness. The history is provided by the patient.  Extremity Weakness    Past Medical History  Diagnosis Date  . Asthma   . Anemia   . Migraine    History reviewed. No pertinent past surgical history. History reviewed. No pertinent family history. History  Substance Use Topics  . Smoking status: Never Smoker   . Smokeless tobacco: Not on file  . Alcohol Use: Yes   OB History   Grav Para Term Preterm Abortions TAB SAB Ect Mult Living                 Review of Systems  Musculoskeletal: Positive for extremity weakness.  All other systems reviewed and are negative.    Allergies  Review of patient's allergies indicates no known allergies.  Home Medications   Current Outpatient Rx  Name  Route  Sig  Dispense  Refill  . albuterol (PROVENTIL HFA;VENTOLIN HFA) 108 (90 BASE) MCG/ACT inhaler   Inhalation   Inhale 2 puffs into the lungs every 6 (six) hours as needed for wheezing.         Marland Kitchen. aspirin-acetaminophen-caffeine (EXCEDRIN MIGRAINE) 250-250-65 MG per tablet   Oral   Take 2 tablets by mouth every 6 (six) hours as needed for pain.         . Multiple Vitamin (MULTIVITAMIN WITH  MINERALS) TABS tablet   Oral   Take 1 tablet by mouth daily.          BP 118/70  Pulse 78  Temp(Src) 97.6 F (36.4 C) (Oral)  Resp 16  SpO2 97%  LMP 11/29/2013 Physical Exam  Nursing note and vitals reviewed. Constitutional: She is oriented to person, place, and time. She appears well-developed and well-nourished. No distress.  HENT:  Head: Normocephalic and atraumatic.  Right Ear: External ear normal.  Left Ear: External ear normal.  Nose: Nose normal.  Eyes: Conjunctivae and EOM are normal. Pupils are equal, round, and reactive to light.  Neck: Normal range of motion and full passive range of motion without pain. Neck supple. No spinous process tenderness and no muscular tenderness present.  Cardiovascular: Normal rate, regular rhythm and normal heart sounds.   Pulmonary/Chest: Effort normal and breath sounds normal.  Musculoskeletal: Normal range of motion. She exhibits no edema.       Left shoulder: Normal.       Left elbow: Normal.       Left wrist: Normal.       Cervical back: Normal.       Left hand: Normal.  Neurological: She is alert and oriented to person, place, and time. She has normal strength. No cranial nerve deficit or sensory deficit. She displays a negative Romberg sign. Coordination and gait normal. GCS eye  subscore is 4. GCS verbal subscore is 5. GCS motor subscore is 6.  Reflex Scores:      Tricep reflexes are 2+ on the right side and 2+ on the left side.      Bicep reflexes are 2+ on the right side and 2+ on the left side.      Brachioradialis reflexes are 2+ on the right side and 2+ on the left side. Skin: Skin is warm and dry. No rash noted. No erythema.  Psychiatric: She has a normal mood and affect. Her behavior is normal.    ED Course  Procedures (including critical care time) Labs Review Labs Reviewed - No data to display Imaging Review No results found.   MDM   1. Paresthesia of arm    Left arm paraesthesia: exam does not suggest  intracranial process or cervical radiculopathy. Suggested to patient that she monitor symptoms closely at home and if symptoms become persistent, suddenly worse or severe, she should have herself re-evaluated at her nearest emergency room. If symptoms persist, she should follow up with her PCP for further evaluation.   Jess Barters [place], [place] 12/13/13 1057

## 2013-12-13 NOTE — ED Notes (Signed)
Pt  Reports  Some  Numbness    -  Tingling  Of  The  l  Arm  For  About  4  Hrs  Ago  She  Reports      She  Woke  Up  With the  Symptoms        She  denys  Any  Injury  Hand  Grip  Is  Moderate          Speech is  Normal      Ambulated  To  Room with a  Steady fluid  Gait

## 2013-12-15 NOTE — ED Provider Notes (Signed)
Medical screening examination/treatment/procedure(s) were performed by a resident physician or non-physician practitioner and as the supervising physician I was immediately available for consultation/collaboration.  Wanona Stare, MD    Camia Dipinto S Benno Brensinger, MD 12/15/13 0746 

## 2015-01-11 ENCOUNTER — Encounter (HOSPITAL_COMMUNITY): Payer: Self-pay | Admitting: Emergency Medicine

## 2015-01-11 ENCOUNTER — Emergency Department (HOSPITAL_COMMUNITY)
Admission: EM | Admit: 2015-01-11 | Discharge: 2015-01-12 | Disposition: A | Discharge status: Home / Self Care | Payer: No Typology Code available for payment source | Attending: Emergency Medicine | Admitting: Emergency Medicine

## 2015-01-11 ENCOUNTER — Emergency Department (HOSPITAL_COMMUNITY): Payer: No Typology Code available for payment source

## 2015-01-11 DIAGNOSIS — J45901 Unspecified asthma with (acute) exacerbation: Secondary | ICD-10-CM | POA: Insufficient documentation

## 2015-01-11 DIAGNOSIS — Z862 Personal history of diseases of the blood and blood-forming organs and certain disorders involving the immune mechanism: Secondary | ICD-10-CM | POA: Insufficient documentation

## 2015-01-11 DIAGNOSIS — Z79899 Other long term (current) drug therapy: Secondary | ICD-10-CM | POA: Insufficient documentation

## 2015-01-11 DIAGNOSIS — G43909 Migraine, unspecified, not intractable, without status migrainosus: Secondary | ICD-10-CM | POA: Insufficient documentation

## 2015-01-11 DIAGNOSIS — J029 Acute pharyngitis, unspecified: Secondary | ICD-10-CM | POA: Insufficient documentation

## 2015-01-11 DIAGNOSIS — R0602 Shortness of breath: Secondary | ICD-10-CM

## 2015-01-11 LAB — CBC
HCT: 34.1 % — ABNORMAL LOW (ref 36.0–46.0)
Hemoglobin: 11.2 g/dL — ABNORMAL LOW (ref 12.0–15.0)
MCH: 29.9 pg (ref 26.0–34.0)
MCHC: 32.8 g/dL (ref 30.0–36.0)
MCV: 91.2 fL (ref 78.0–100.0)
PLATELETS: 227 10*3/uL (ref 150–400)
RBC: 3.74 MIL/uL — AB (ref 3.87–5.11)
RDW: 12.2 % (ref 11.5–15.5)
WBC: 9.1 10*3/uL (ref 4.0–10.5)

## 2015-01-11 LAB — BASIC METABOLIC PANEL
Anion gap: 7 (ref 5–15)
BUN: 11 mg/dL (ref 6–23)
CALCIUM: 8.9 mg/dL (ref 8.4–10.5)
CO2: 22 mmol/L (ref 19–32)
Chloride: 107 mmol/L (ref 96–112)
Creatinine, Ser: 0.73 mg/dL (ref 0.50–1.10)
GFR calc Af Amer: >90 mL/min (ref >90)
Glucose, Bld: 101 mg/dL — ABNORMAL HIGH (ref 70–99)
Potassium: 3.7 mmol/L (ref 3.5–5.1)
SODIUM: 136 mmol/L (ref 135–145)

## 2015-01-11 LAB — I-STAT TROPONIN, ED: Troponin i, poc: 0.01 ng/mL (ref 0.00–0.08)

## 2015-01-11 MED ORDER — ACETAMINOPHEN 500 MG PO TABS
1000.0000 mg | ORAL_TABLET | Freq: Once | ORAL | Status: AC
  Administered 2015-01-12: 1000 mg via ORAL
  Filled 2015-01-11: qty 2

## 2015-01-11 NOTE — ED Provider Notes (Signed)
CSN: [id number]     Arrival date & time 01/11/15  2214 History   First MD Initiated Contact with Patient 01/11/15 2319     Chief Complaint  Patient presents with  . Chest Pain     (Consider location/radiation/quality/duration/timing/severity/associated sxs/prior Treatment) HPI Comments: The pt is a 24 y/o female with hx of 1 day of chest heaviness, sore throat and sob when she talks - she has no f/c/n/v and no abd pain, back pain, dysuria, diarrhea.  Sx are mild and persistent.  No sick contacts, no chronic medical problems.   Patient is a 24 y.o. female presenting with chest pain. The history is provided by the patient.  Chest Pain   Past Medical History  Diagnosis Date  . Asthma   . Anemia   . Migraine    History reviewed. No pertinent past surgical history. No family history on file. History  Substance Use Topics  . Smoking status: Never Smoker   . Smokeless tobacco: Not on file  . Alcohol Use: Yes   OB History    No data available     Review of Systems  Cardiovascular: Positive for chest pain.  All other systems reviewed and are negative.     Allergies  Review of patient's allergies indicates no known allergies.  Home Medications   Prior to Admission medications   Medication Sig Start Date End Date Taking? Authorizing Provider  albuterol (PROVENTIL HFA;VENTOLIN HFA) 108 (90 BASE) MCG/ACT inhaler Inhale 2 puffs into the lungs every 6 (six) hours as needed for wheezing.   Yes Historical Provider, MD  aspirin-acetaminophen-caffeine (EXCEDRIN MIGRAINE) [phone] MG per tablet Take 2 tablets by mouth every 6 (six) hours as needed for pain.   Yes Historical Provider, MD   BP 107/71 mmHg  Pulse 77  Temp(Src) 98.1 F (36.7 C) (Oral)  Resp 18  Ht  (1.651 m)  Wt 162 lb (73.483 kg)  BMI 26.96 kg/m2  SpO2 97%  LMP 01/08/2015 Physical Exam  Constitutional: She appears well-developed and well-nourished. No distress.  HENT:  Head: Normocephalic and  atraumatic.  Mouth/Throat: Oropharynx is clear and moist. No oropharyngeal exudate.  Normocephalic, atraumatic, clear oropharynx without any swelling, exudate, asymmetry, hypertrophy or erythema.  Moist mucous membranes, clear nasal passages, normal tympanic membranes without any effusions, erythema or opacification.  Eyes: Conjunctivae and EOM are normal. Pupils are equal, round, and reactive to light. Right eye exhibits no discharge. Left eye exhibits no discharge. No scleral icterus.  Neck: Normal range of motion. Neck supple. No JVD present. No thyromegaly present.  Very supple neck, no lymphadenopathy, no stiffness, no meningismus. No thyromegaly, trachea is midline. There is no torticollis  Cardiovascular: Normal rate, regular rhythm, normal heart sounds and intact distal pulses.  Exam reveals no gallop and no friction rub.   No murmur heard. The patient has normal heart sounds, no murmurs rubs or gallops, no tachycardia, strong pulses at the radial arteries bilaterally, no JVD, normal capillary refill time less than 2 seconds.  Pulmonary/Chest: Effort normal and breath sounds normal. No respiratory distress. She has no wheezes. She has no rales.  Lungs sounds are clear to auscultation bilaterally, no wheezes rhonchi or rales, no increased work of breathing, speaks in full sentences, no cyanosis, no accessory muscle use.  Abdominal: Soft. Bowel sounds are normal. She exhibits no distension and no mass. There is no tenderness.  The abdomen is soft and nontender, there is no tenderness in the right lower quadrant, right upper quadrant, there  is no CVA tenderness, there is no guarding, no masses. There is normal bowel sounds. There are no peritoneal signs, the abdomen is nonsurgical  Musculoskeletal: Normal range of motion. She exhibits no edema or tenderness.  Lymphadenopathy:    She has no cervical adenopathy.  Neurological: She is alert. Coordination normal.  Skin: Skin is warm and dry. No  rash noted. No erythema.  Psychiatric: She has a normal mood and affect. Her behavior is normal.  Nursing note and vitals reviewed.   ED Course  Procedures (including critical care time) Labs Review Labs Reviewed  CBC - Abnormal; Notable for the following:    RBC 3.74 (*)    Hemoglobin 11.2 (*)    HCT 34.1 (*)    All other components within normal limits  BASIC METABOLIC PANEL - Abnormal; Notable for the following:    Glucose, Bld 101 (*)    All other components within normal limits  RAPID STREP SCREEN  CULTURE, GROUP A STREP  I-STAT TROPOININ, ED    Imaging Review Dg Chest 2 View  01/11/2015   CLINICAL DATA:  Chest pain  EXAM: CHEST  2 VIEW  COMPARISON:  09/04/2012  FINDINGS: Cardiomediastinal contours within normal range. No confluent airspace opacity, pleural effusion, pneumothorax. No acute osseous finding.  IMPRESSION: No radiographic evidence of active cardiopulmonary disease.   Electronically Signed   By: Jearld LeschAndrew  DelGaizo M.D.   On: 01/11/2015 23:09     EKG Interpretation   Date/Time:  Wednesday January 11 2015 22:18:24 EDT Ventricular Rate:  79 PR Interval:  148 QRS Duration: 86 QT Interval:  384 QTC Calculation: 440 R Axis:   82 Text Interpretation:  Normal sinus rhythm Normal ECG since last tracing no  significant change Confirmed by Sarahjane Matherly  MD, Xiana Carns ([id number]) on 01/11/2015  11:36:59 PM      MDM   Final diagnoses:  Sore throat  Shortness of breath    The patient has normal vital signs, normal x-ray, labs are unremarkable, strep test ordered, patient otherwise looks stable for discharge, not having any difficulty breathing, normal EKG, benign appearance.  Strep neg, stable for d/c.  Eber HongBrian Hutch Rhett, MD 01/12/15 [phone]

## 2015-01-11 NOTE — ED Notes (Signed)
Pt reports cp that she noticed when she woke up this am, denies cough. C/o sore throat and sob when she talks.

## 2015-01-12 LAB — RAPID STREP SCREEN (MED CTR MEBANE ONLY): Streptococcus, Group A Screen (Direct): NEGATIVE

## 2015-01-12 NOTE — Discharge Instructions (Signed)
Please call your doctor for a followup appointment within 24-48 hours. When you talk to your doctor please let them know that you were seen in the emergency department and have them acquire all of your records so that they can discuss the findings with you and formulate a treatment plan to fully care for your new and ongoing problems. ° °

## 2015-01-14 LAB — CULTURE, GROUP A STREP: STREP A CULTURE: NEGATIVE

## 2015-03-21 ENCOUNTER — Emergency Department (HOSPITAL_COMMUNITY)
Admission: EM | Admit: 2015-03-21 | Discharge: 2015-03-21 | Disposition: A | Discharge status: Home / Self Care | Payer: No Typology Code available for payment source | Attending: Emergency Medicine | Admitting: Emergency Medicine

## 2015-03-21 ENCOUNTER — Encounter (HOSPITAL_COMMUNITY): Payer: Self-pay | Admitting: Emergency Medicine

## 2015-03-21 DIAGNOSIS — G43909 Migraine, unspecified, not intractable, without status migrainosus: Secondary | ICD-10-CM | POA: Insufficient documentation

## 2015-03-21 DIAGNOSIS — Z79899 Other long term (current) drug therapy: Secondary | ICD-10-CM | POA: Insufficient documentation

## 2015-03-21 DIAGNOSIS — Z862 Personal history of diseases of the blood and blood-forming organs and certain disorders involving the immune mechanism: Secondary | ICD-10-CM | POA: Insufficient documentation

## 2015-03-21 DIAGNOSIS — R590 Localized enlarged lymph nodes: Secondary | ICD-10-CM | POA: Insufficient documentation

## 2015-03-21 DIAGNOSIS — J45909 Unspecified asthma, uncomplicated: Secondary | ICD-10-CM | POA: Insufficient documentation

## 2015-03-21 DIAGNOSIS — H6691 Otitis media, unspecified, right ear: Secondary | ICD-10-CM | POA: Insufficient documentation

## 2015-03-21 MED ORDER — AMOXICILLIN 500 MG PO CAPS: 500.0000 mg | ORAL_CAPSULE | Freq: Three times a day (TID) | ORAL | Status: DC

## 2015-03-21 MED ORDER — PSEUDOEPHEDRINE HCL 60 MG PO TABS: 60.0000 mg | ORAL_TABLET | Freq: Three times a day (TID) | ORAL | Status: DC | PRN

## 2015-03-21 NOTE — Discharge Instructions (Signed)
Return to the emergency room with worsening of symptoms, new symptoms or with symptoms that are concerning , especially fevers, stiff neck, worsening headache, nausea/vomiting, visual changes or slurred speech, chest pain, shortness of breath, cough with thick colored mucous or blood Drink plenty of fluids with electrolytes especially Gatorade. OTC cold medications such as mucinex, nyquil, dayquil are recommended. Chloraseptic for sore throat. Read below information and follow recommendations. Otitis Media Otitis media is redness, soreness, and inflammation of the middle ear. Otitis media may be caused by allergies or, most commonly, by infection. Often it occurs as a complication of the common cold. SIGNS AND SYMPTOMS Symptoms of otitis media may include:  Earache.  Fever.  Ringing in your ear.  Headache.  Leakage of fluid from the ear. DIAGNOSIS To diagnose otitis media, your health care provider will examine your ear with an otoscope. This is an instrument that allows your health care provider to see into your ear in order to examine your eardrum. Your health care provider also will ask you questions about your symptoms. TREATMENT  Typically, otitis media resolves on its own within 3-5 days. Your health care provider may prescribe medicine to ease your symptoms of pain. If otitis media does not resolve within 5 days or is recurrent, your health care provider may prescribe antibiotic medicines if he or she suspects that a bacterial infection is the cause. HOME CARE INSTRUCTIONS   If you were prescribed an antibiotic medicine, finish it all even if you start to feel better.  Take medicines only as directed by your health care provider.  Keep all follow-up visits as directed by your health care provider. SEEK MEDICAL CARE IF:  You have otitis media only in one ear, or bleeding from your nose, or both.  You notice a lump on your neck.  You are not getting better in 3-5 days.  You  feel worse instead of better. SEEK IMMEDIATE MEDICAL CARE IF:   You have pain that is not controlled with medicine.  You have swelling, redness, or pain around your ear or stiffness in your neck.  You notice that part of your face is paralyzed.  You notice that the bone behind your ear (mastoid) is tender when you touch it. MAKE SURE YOU:   Understand these instructions.  Will watch your condition.  Will get help right away if you are not doing well or get worse. Document Released: 06/14/2004 Document Revised: 01/24/2014 Document Reviewed: 04/06/2013 [facility]Exit[facility] Patient Information 2015 [place], [place]. This information is not intended to replace advice given to you by your health care provider. Make sure you discuss any questions you have with your health care provider.

## 2015-03-21 NOTE — ED Provider Notes (Signed)
CSN: [id number]     Arrival date & time 03/21/15  1731 History  This chart was scribed for non-physician practitioner, Oswaldo Conroy, PA-C working with Gilda Crease, MD by Placido Sou, ED scribe. This patient was seen in room TR05C/TR05C and the patient's care was started at 6:34 PM.      Chief Complaint  Patient presents with  . Facial Pain   The history is provided by the patient. No language interpreter was used.    HPI Comments: Mindy Rios is a 24 y.o. female who presents to the Emergency Department complaining of intermittent, moderate, right ear pain with onset 10 days ago and worsening symptoms beginning 2 days ago. Pt notes a radiation of pain to the right side of her face and describes the pain as a "throbbing". She further notes constant, mild, sinus congestion as an associated symptom. She notes taking a Aleve and Ibuprofen with no relief of symptoms. Pt notes swimming just before the onset of symptoms and further notes she has a history of childhood ear infections. Pt denies any recent use of antibiotics. She denies fever, chills, sore throat, cough, SOB, CP, nausea, vomiting, hearing impairment, or ear drainage.  Past Medical History  Diagnosis Date  . Asthma   . Anemia   . Migraine    History reviewed. No pertinent past surgical history. No family history on file. History  Substance Use Topics  . Smoking status: Never Smoker   . Smokeless tobacco: Not on file  . Alcohol Use: Yes   OB History    No data available     Review of Systems  Constitutional: Negative for fever and chills.  HENT: Positive for congestion, ear pain, postnasal drip and rhinorrhea. Negative for ear discharge and sore throat.   Respiratory: Negative for cough and shortness of breath.   Cardiovascular: Negative for chest pain.  Gastrointestinal: Negative for nausea and vomiting.  Musculoskeletal: Positive for myalgias and neck pain.      Allergies  Review of patient's  allergies indicates no known allergies.  Home Medications   Prior to Admission medications   Medication Sig Start Date End Date Taking? Authorizing Provider  albuterol (PROVENTIL HFA;VENTOLIN HFA) 108 (90 BASE) MCG/ACT inhaler Inhale 2 puffs into the lungs every 6 (six) hours as needed for wheezing.    Historical Provider, MD  amoxicillin (AMOXIL) 500 MG capsule Take 1 capsule (500 mg total) by mouth 3 (three) times daily. 03/21/15   Oswaldo Conroy, PA-C  aspirin-acetaminophen-caffeine (EXCEDRIN MIGRAINE) [phone] MG per tablet Take 2 tablets by mouth every 6 (six) hours as needed for pain.    Historical Provider, MD  pseudoephedrine (SUDAFED) 60 MG tablet Take 1 tablet (60 mg total) by mouth every 8 (eight) hours as needed for congestion. 03/21/15   Oswaldo Conroy, PA-C   BP 116/87 mmHg  Pulse 72  Temp(Src) 98.2 F (36.8 C) (Oral)  Resp 16  Ht  (1.651 m)  Wt 158 lb 1 oz (71.697 kg)  BMI 26.30 kg/m2  SpO2 100%  LMP 03/10/2015 Physical Exam  Constitutional: She appears well-developed and well-nourished. No distress.  HENT:  Head: Normocephalic and atraumatic.  Right Ear: No tenderness. No mastoid tenderness. Tympanic membrane is erythematous and bulging.  Left Ear: Tympanic membrane is retracted.  Mouth/Throat: Uvula is midline. No trismus in the jaw. No oropharyngeal exudate.  cervical lymphadenopathy; no facial swelling; oropharynx erythema without exudate; uvula midline with no trismus; no external right ear tenderness; no right mastoid tenderness; right  bulging tympanic membrane with erythema and puss; right TM intact; left TM retracted without erythema; left TM intact   Eyes: Conjunctivae and EOM are normal. Right eye exhibits no discharge. Left eye exhibits no discharge.  Neck: Normal range of motion. Neck supple.  No meningismus  Cardiovascular: Normal rate and regular rhythm.   Pulmonary/Chest: Effort normal and breath sounds normal. No respiratory distress. She has no  wheezes.  Abdominal: Soft. Bowel sounds are normal. She exhibits no distension. There is no tenderness.  Neurological: She is alert. No cranial nerve deficit. Coordination normal.  Speech fluent and goal oriented. Patient moves all four extremities without any focal neurological deficits and has no facial droop. Normal gait.   Skin: Skin is warm and dry. She is not diaphoretic.  Nursing note and vitals reviewed.   ED Course  Procedures  DIAGNOSTIC STUDIES: Oxygen Saturation is 98% on RA, normal by my interpretation.    COORDINATION OF CARE: 6:40 PM Discussed treatment plan with pt at bedside and pt agreed to plan.  Labs Review Labs Reviewed - No data to display  Imaging Review No results found.   EKG Interpretation None      Meds given in ED:  Medications - No data to display  Discharge Medication List as of 03/21/2015  6:45 PM    START taking these medications   Details  amoxicillin (AMOXIL) 500 MG capsule Take 1 capsule (500 mg total) by mouth 3 (three) times daily., Starting 03/21/2015, Until Discontinued, Print    pseudoephedrine (SUDAFED) 60 MG tablet Take 1 tablet (60 mg total) by mouth every 8 (eight) hours as needed for congestion., Starting 03/21/2015, Until Discontinued, Print          MDM   Final diagnoses:  Acute right otitis media, recurrence not specified, unspecified otitis media type   Patient presents with otalgia and exam consistent with acute otitis media. No concern for acute mastoiditis, meningitis.  No antibiotic use in the last month.  Patient discharged home with Amoxicillin.  I have also discussed reasons to return immediately to the ER.  Parent expresses understanding and agrees with plan.  I personally performed the services described in this documentation, which was scribed in my presence. The recorded information has been reviewed and is accurate.    Oswaldo ConroyVictoria Sion Thane, PA-C 03/21/15 1901  Gilda Creasehristopher J Pollina, MD 03/22/15 Moses Manners0025

## 2015-03-21 NOTE — ED Notes (Signed)
Pt c/o right ear pain and pain in right side of face for approx 1 week

## 2015-05-27 ENCOUNTER — Encounter (HOSPITAL_COMMUNITY): Payer: Self-pay | Admitting: Emergency Medicine

## 2015-05-27 ENCOUNTER — Emergency Department (HOSPITAL_COMMUNITY)
Admission: EM | Admit: 2015-05-27 | Discharge: 2015-05-27 | Disposition: A | Discharge status: Home / Self Care | Payer: Managed Care, Other (non HMO) | Attending: Emergency Medicine | Admitting: Emergency Medicine

## 2015-05-27 DIAGNOSIS — Z862 Personal history of diseases of the blood and blood-forming organs and certain disorders involving the immune mechanism: Secondary | ICD-10-CM | POA: Insufficient documentation

## 2015-05-27 DIAGNOSIS — Z8679 Personal history of other diseases of the circulatory system: Secondary | ICD-10-CM | POA: Diagnosis not present

## 2015-05-27 DIAGNOSIS — K029 Dental caries, unspecified: Secondary | ICD-10-CM | POA: Insufficient documentation

## 2015-05-27 DIAGNOSIS — K088 Other specified disorders of teeth and supporting structures: Secondary | ICD-10-CM | POA: Insufficient documentation

## 2015-05-27 DIAGNOSIS — Z7982 Long term (current) use of aspirin: Secondary | ICD-10-CM | POA: Insufficient documentation

## 2015-05-27 DIAGNOSIS — H9201 Otalgia, right ear: Secondary | ICD-10-CM | POA: Diagnosis not present

## 2015-05-27 DIAGNOSIS — J45909 Unspecified asthma, uncomplicated: Secondary | ICD-10-CM | POA: Diagnosis not present

## 2015-05-27 DIAGNOSIS — K0889 Other specified disorders of teeth and supporting structures: Secondary | ICD-10-CM

## 2015-05-27 MED ORDER — OXYCODONE-ACETAMINOPHEN 5-325 MG PO TABS
1.0000 | ORAL_TABLET | Freq: Once | ORAL | Status: AC
  Administered 2015-05-27: 1 via ORAL
  Filled 2015-05-27: qty 1

## 2015-05-27 MED ORDER — IBUPROFEN 800 MG PO TABS: 800.0000 mg | ORAL_TABLET | Freq: Three times a day (TID) | ORAL | Status: DC

## 2015-05-27 MED ORDER — PENICILLIN V POTASSIUM 500 MG PO TABS: 500.0000 mg | ORAL_TABLET | Freq: Four times a day (QID) | ORAL | Status: AC

## 2015-05-27 NOTE — ED Notes (Signed)
Pt from home with c/o right ear pain and right facial pain starting last night.  Pt reports hx of the same this past June with dx of ear infection.  Pt has poor dental hygiene.  NAD, A&O.

## 2015-05-27 NOTE — ED Provider Notes (Signed)
CSN: [id number]     Arrival date & time 05/27/15  1056 History   First MD Initiated Contact with Patient 05/27/15 1118     Chief Complaint  Patient presents with  . Dental Pain  . Otalgia     (Consider location/radiation/quality/duration/timing/severity/associated sxs/prior Treatment) HPI Comments: Patient presents today with right sided facial pain, right ear pain, and right upper and lower dental pain.  Onset of pain last evening and gradually worsening.  She took a Goody's Powder for the pain with some relief.  She denies fever, chills, nausea, vomiting, difficulty swallowing, or stiffness in her neck.  She reports that she feels like the right side of her face is swollen.  She does not have a dentist.  Denies any dental injury or trauma.    The history is provided by the patient.    Past Medical History  Diagnosis Date  . Asthma   . Anemia   . Migraine    History reviewed. No pertinent past surgical history. History reviewed. No pertinent family history. Social History  Substance Use Topics  . Smoking status: Never Smoker   . Smokeless tobacco: None  . Alcohol Use: Yes   OB History    No data available     Review of Systems  All other systems reviewed and are negative.     Allergies  Review of patient's allergies indicates no known allergies.  Home Medications   Prior to Admission medications   Medication Sig Start Date End Date Taking? Authorizing Provider  aspirin-acetaminophen-caffeine (EXCEDRIN MIGRAINE) [phone] MG per tablet Take 2 tablets by mouth every 6 (six) hours as needed for pain.   Yes Historical Provider, MD  Aspirin-Acetaminophen-Caffeine (GOODYS EXTRA STRENGTH) [phone] MG PACK Take 1 Package by mouth daily as needed (pain).   Yes Historical Provider, MD  amoxicillin (AMOXIL) 500 MG capsule Take 1 capsule (500 mg total) by mouth 3 (three) times daily. Patient not taking: Reported on 05/27/2015 03/21/15   Oswaldo Conroy, PA-C  pseudoephedrine  (SUDAFED) 60 MG tablet Take 1 tablet (60 mg total) by mouth every 8 (eight) hours as needed for congestion. Patient not taking: Reported on 05/27/2015 03/21/15   Oswaldo Conroy, PA-C   BP 123/79 mmHg  Pulse 81  Temp(Src) 98.8 F (37.1 C) (Oral)  Resp 17  Ht  (1.651 m)  Wt 154 lb (69.854 kg)  BMI 25.63 kg/m2  SpO2 98%  LMP 05/08/2015 (Approximate) Physical Exam  Constitutional: She appears well-developed and well-nourished.  HENT:  Head: Normocephalic and atraumatic.  Right Ear: Tympanic membrane and ear canal normal.  Left Ear: Tympanic membrane and ear canal normal.  Mouth/Throat: No trismus in the jaw. Abnormal dentition. Dental caries present. No dental abscesses or uvula swelling.  Poor dentition Tenderness to palpation of the right upper and lower gingiva.  No obvious abscess.  No sublingual tenderness or swelling No submental or submandibular lymphadenopathy No obvious facial swelling  Neck: Normal range of motion. Neck supple.  Cardiovascular: Normal rate, regular rhythm and normal heart sounds.   Pulmonary/Chest: Effort normal and breath sounds normal.  Skin: Skin is warm and dry.  Nursing note and vitals reviewed.   ED Course  Procedures (including critical care time) Labs Review Labs Reviewed - No data to display  Imaging Review No results found. I have personally reviewed and evaluated these images and lab results as part of my medical decision-making.   EKG Interpretation None      MDM   Final diagnoses:  None  Patient with toothache.  No gross dental pain.  Exam unconcerning for Ludwig's angina or spread of infection.  Will treat with penicillin and Ibuprofen.  Urged patient to follow-up with dentist.       Santiago Glad, PA-C 05/27/15 1634  Melene Plan, DO 05/28/15 1404

## 2016-08-21 ENCOUNTER — Encounter (HOSPITAL_COMMUNITY): Payer: Self-pay | Admitting: Emergency Medicine

## 2016-08-21 ENCOUNTER — Ambulatory Visit (HOSPITAL_COMMUNITY)
Admission: EM | Admit: 2016-08-21 | Discharge: 2016-08-21 | Disposition: A | Discharge status: Home / Self Care | Payer: Managed Care, Other (non HMO) | Attending: Family Medicine | Admitting: Family Medicine

## 2016-08-21 DIAGNOSIS — D519 Vitamin B12 deficiency anemia, unspecified: Secondary | ICD-10-CM | POA: Diagnosis not present

## 2016-08-21 NOTE — ED Provider Notes (Signed)
CSN: [id number]     Arrival date & time 08/21/16  1510 History   First MD Initiated Contact with Patient 08/21/16 1615     Chief Complaint  Patient presents with  . Tingling   (Consider location/radiation/quality/duration/timing/severity/associated sxs/prior Treatment) Ms. Mindy Rios is a well-appearing 25 y.o female, presents today for tingling sensation on the left side of her tongue onset last night shortly after she drank some sprite. This tongue tingling eventually resolved spontaneously but returned this morning. Patient denies swollen tongue, lips or breathing difficulties. She did some research and saw that anemia could cause tingling sensation. Patient reports that she has B12 deficiency Anemia that is untreated. She reports that she used to take oral B12 supplement but "it made me sick".       Past Medical History:  Diagnosis Date  . Anemia   . Asthma   . Migraine    History reviewed. No pertinent surgical history. No family history on file. Social History  Substance Use Topics  . Smoking status: Never Smoker  . Smokeless tobacco: Never Used  . Alcohol use Yes     Comment: rarely   OB History    No data available     Review of Systems  Constitutional: Negative for chills, fatigue and fever.  HENT:       +tingling sensation at the left tongue  Respiratory: Negative for shortness of breath and wheezing.   Gastrointestinal: Negative for abdominal pain, diarrhea, nausea and vomiting.  Neurological: Negative for dizziness, tremors, syncope, weakness, numbness and headaches.    Allergies  Patient has no known allergies.  Home Medications   Prior to Admission medications   Medication Sig Start Date End Date Taking? Authorizing Provider  amoxicillin (AMOXIL) 500 MG capsule Take 1 capsule (500 mg total) by mouth 3 (three) times daily. Patient not taking: Reported on 05/27/2015 03/21/15   Oswaldo ConroyVictoria Creech, PA-C  aspirin-acetaminophen-caffeine (EXCEDRIN MIGRAINE) [phone]  MG per tablet Take 2 tablets by mouth every 6 (six) hours as needed for pain.    Historical Provider, MD  Aspirin-Acetaminophen-Caffeine (GOODYS EXTRA STRENGTH) [phone] MG PACK Take 1 Package by mouth daily as needed (pain).    Historical Provider, MD  ibuprofen (ADVIL,MOTRIN) 800 MG tablet Take 1 tablet (800 mg total) by mouth 3 (three) times daily. 05/27/15   Heather Laisure, PA-C  pseudoephedrine (SUDAFED) 60 MG tablet Take 1 tablet (60 mg total) by mouth every 8 (eight) hours as needed for congestion. Patient not taking: Reported on 05/27/2015 03/21/15   Oswaldo ConroyVictoria Creech, PA-C   Meds Ordered and Administered this Visit  Medications - No data to display  BP 106/72   Pulse 64   Temp 98.5 F (36.9 C) (Oral)   Resp 16   Ht 5\' 5"  (1.651 m)   Wt 167 lb (75.8 kg)   LMP 08/08/2016   SpO2 98%   BMI 27.79 kg/m  No data found.   Physical Exam  Constitutional: She is oriented to person, place, and time. She appears well-developed and well-nourished.  HENT:  Head: Normocephalic and atraumatic.  No angioedema noted  Eyes: EOM are normal. Pupils are equal, round, and reactive to light.  Neck: Normal range of motion. Neck supple.  Cardiovascular: Normal rate, regular rhythm and normal heart sounds.   Pulmonary/Chest: Effort normal and breath sounds normal. No respiratory distress. She has no wheezes.  Abdominal: Soft. Bowel sounds are normal.  Neurological: She is alert and oriented to person, place, and time. No cranial nerve deficit or sensory  deficit. She exhibits normal muscle tone. Coordination normal.  Skin: Skin is warm and dry.  Nursing note and vitals reviewed.   Urgent Care Course   Clinical Course     Procedures (including critical care time)  Labs Review Labs Reviewed - No data to display  Imaging Review No results found.  MDM   1. Anemia due to vitamin B12 deficiency, unspecified B12 deficiency type    No red flags noted on exam that would prompt an emergency  workup. Tingling in her tongue is suspected to be from her untreated B12 deficiency. Will refer patient back to her PCP for treatment. Patient may benefit from parenteral cyanocobalamin supplement if she cannot tolerate the supplement orally. Plan of care explained to patient and she does not have any questions. Discharge paperwork given.    Lucia EstelleFeng Anisa Leanos, NP 08/21/16 [phone]

## 2016-08-21 NOTE — ED Triage Notes (Signed)
PT reports she drank a sprite last night and the left side of her tongue began to tingle. The feeling subsided, but then returned this AM. PT reports she is anemic and she read that that could be the cause. PT denies any numbness, tingling, or weakness over any other portion of body.

## 2018-03-22 ENCOUNTER — Encounter (HOSPITAL_COMMUNITY): Payer: Self-pay | Admitting: Emergency Medicine

## 2018-03-22 ENCOUNTER — Ambulatory Visit (INDEPENDENT_AMBULATORY_CARE_PROVIDER_SITE_OTHER): Payer: Self-pay

## 2018-03-22 ENCOUNTER — Ambulatory Visit (HOSPITAL_COMMUNITY)
Admission: EM | Admit: 2018-03-22 | Discharge: 2018-03-22 | Disposition: A | Discharge status: Home / Self Care | Payer: Managed Care, Other (non HMO) | Attending: Family Medicine | Admitting: Family Medicine

## 2018-03-22 DIAGNOSIS — M79671 Pain in right foot: Secondary | ICD-10-CM

## 2018-03-22 DIAGNOSIS — M25512 Pain in left shoulder: Secondary | ICD-10-CM

## 2018-03-22 MED ORDER — CYCLOBENZAPRINE HCL 10 MG PO TABS: 10.0000 mg | ORAL_TABLET | Freq: Two times a day (BID) | ORAL | 0 refills | Status: DC | PRN

## 2018-03-22 MED ORDER — IBUPROFEN 800 MG PO TABS: 800.0000 mg | ORAL_TABLET | Freq: Three times a day (TID) | ORAL | 0 refills | Status: DC

## 2018-03-22 NOTE — Discharge Instructions (Signed)
Use anti-inflammatories for pain/swelling. You may take up to 800 mg Ibuprofen every 8 hours with food. You may supplement Ibuprofen with Tylenol [phone] mg every 8 hours.   You may use flexeril as needed to help with pain. This is a muscle relaxer and causes sedation- please use only at bedtime or when you will be home and not have to drive/work  May try to use Tylenol PM to help with sleep  No fracture on x-ray, please use ankle brace for support over the next 2 weeks, weight-bear as tolerated, slowly transition from crutches to weightbearing as pain improving.  Please follow-up if symptoms not improving or worsening, developing nausea, vomiting, vision changes.
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
## 2019-06-29 ENCOUNTER — Other Ambulatory Visit: Payer: Self-pay

## 2019-06-29 DIAGNOSIS — Z20822 Contact with and (suspected) exposure to covid-19: Secondary | ICD-10-CM

## 2019-07-01 LAB — NOVEL CORONAVIRUS, NAA: SARS-CoV-2, NAA: NOT DETECTED

## 2019-07-19 ENCOUNTER — Ambulatory Visit (INDEPENDENT_AMBULATORY_CARE_PROVIDER_SITE_OTHER): Payer: Medicaid - Out of State | Admitting: Obstetrics & Gynecology

## 2019-07-19 ENCOUNTER — Encounter: Payer: Self-pay | Admitting: Obstetrics & Gynecology

## 2019-07-19 ENCOUNTER — Other Ambulatory Visit: Payer: Self-pay

## 2019-07-19 VITALS — BP 121/71 | HR 67 | Ht 66.0 in | Wt 163.3 lb

## 2019-07-19 DIAGNOSIS — N751 Abscess of Bartholin's gland: Secondary | ICD-10-CM

## 2019-07-19 NOTE — Progress Notes (Signed)
Pt presents to have ward catheter removed. 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 She was seen at the ER in [facility] with [facility]. Declines having any other issues today.  Pt states that her last pap was in 2011

## 2019-07-19 NOTE — Patient Instructions (Signed)
Bartholin's Cyst  A Bartholin's cyst is a fluid-filled sac that forms on a Bartholin's gland. Bartholin's glands are small glands in the folds of skin around the vaginal opening (labia). These glands produce a fluid to moisten (lubricate) the outside of the vagina during sex. A cyst that is not large or infected may not cause any problems or require treatment. If the cyst gets infected, it is called a Bartholin's abscess. An abscess may cause symptoms such as pain and swelling and is more likely to require treatment. What are the causes? This condition may be caused by a blocked Bartholin's gland. These glands can become blocked due to natural buildup of fluid and oils. Bacteria inside of the cyst can cause infection. In many cases, the cause is not known. What increases the risk? You may be at increased risk of developing a Bartholin's cyst or abscess if:  You are of childbearing age.  You have a history of Bartholin's cysts or abscesses.  You have diabetes.  You have an STI (sexually transmitted infection). What are the signs or symptoms? Symptoms may include:  A bulge or lump on the labia, near the lower opening of the vagina.  Discomfort or pain. This may get worse during sex or when walking.  Redness, swelling, or fluid draining from the area. These may be signs of an abscess. How severe your symptoms are depends on the size of your cyst and whether it is infected. Infection causes symptoms to get more severe. How is this diagnosed? This condition may be diagnosed based on:  Your symptoms and medical history.  A physical exam to check for swelling in your vaginal area. You may lie on your back on an exam table and have your feet placed into footrests for the exam.  Blood tests to check for infections.  Removal of a fluid sample from the cyst or abscess (biopsy) for testing. You may work with a health care provider who specializes in women's health (gynecologist) for diagnosis  and treatment. How is this treated? If your cyst is small, not infected, and not causing symptoms, you may not need any treatment. These cysts often go away on their own, with home care such as hot baths or warm compresses. If you have a large cyst or an abscess, treatment may include:  Antibiotic medicine.  A procedure to drain the fluid inside the cyst or abscess. These procedures involve making an incision in the cyst or abscess so that the fluid drains out, and then one of the following may be done: ? A small, thin tube (catheter) may be placed inside the cyst or abscess so that it does not close and fill up with fluid again (fistulization). The catheter will be removed at a follow-up visit. ? The edges of the incision may be stitched to your skin so that the cyst or abscess stays open (marsupialization). This allows it to continue to drain and not fill up with fluid again. If you have cysts or abscesses that keep returning (recurring) and have required incision and drainage multiple times, your health care provider may talk with you about surgery to remove the Bartholin's gland. Follow these instructions at home: Medicines  Take over-the-counter and prescription medicines only as told by your health care provider.  If you were prescribed an antibiotic medicine, take it as told by your health care provider. Do not stop taking the antibiotic even if your condition improves. Managing pain and swelling  Try sitz baths to help with   pain and swelling. A sitz bath is a warm water bath in which the water only comes up to your hips and should cover your buttocks. You may take sitz baths several times a day.  Apply heat to the affected area as often as needed. Use the heat source that your health care provider recommends, such as a moist heat pack or a heating pad. ? Place a towel between your skin and the heat source. ? Leave the heat on for 20-30 minutes. ? Remove the heat if your skin turns  bright red. This is especially important if you are unable to feel pain, heat, or cold. You may have a greater risk of getting burned. General instructions  If your cyst or abscess was drained, follow instructions from your health care provider about how to take care of your wound. Use feminine pads as needed to absorb any drainage.  Do not push on or squeeze your cyst.  Do not have sex until the cyst has gone away or your wound from drainage has healed.  Take these steps to help prevent a Bartholin's cyst from returning, and to prevent other Bartholin's cysts from developing: ? Take a bath or shower once a day. Clean your vaginal area with mild soap and water when you bathe. ? Practice safe sex to prevent STIs. Talk with your health care provider about how to prevent STIs and which forms of birth control (contraception) may be best for you.  Keep all follow-up visits as told by your health care provider. This is important. Contact a health care provider if:  You have a fever.  You develop redness, swelling, or pain around your cyst.  You have fluid, blood, pus, or a bad smell coming from your cyst.  You have a cyst that gets larger or comes back. Summary  A Bartholin's cyst is a fluid-filled sac that forms on a Bartholin's gland. These glands are in the folds of skin around the vaginal opening (labia).  If your cyst is small, not infected, and not causing symptoms, you may not need any treatment. These cysts often go away on their own, with home care such as hot baths or warm compresses.  If you have a large cyst or an abscess, your health care provider may perform a procedure to drain the fluid.  If you have cysts or abscesses that keep returning (recurring) and have required incision and drainage multiple times, your health care provider may talk with you about surgery to remove the Bartholin's gland. This information is not intended to replace advice given to you by your health  care provider. Make sure you discuss any questions you have with your health care provider. Document Released: 09/09/2005 Document Revised: 07/02/2018 Document Reviewed: 06/11/2017 Elsevier Patient Education  2020 Elsevier Inc.  

## 2019-07-19 NOTE — Progress Notes (Signed)
Patient ID: Mindy Rios, female   DOB: 02-02-1991, 28 y.o.   MRN: [id number]  Chief Complaint  Patient presents with  . Follow-up  s/p I&D vulvar abscess  HPI Mindy Rios is a 28 y.o. female.  G1P0010 Patient's last menstrual period was 07/03/2019. She presented to [facility] 10/3 with bartholin's gland abscess and the word catheter is still in place. She was told to have the catheter removed. Very little discomfort now, notes a continued drainage at the site. HPI  Past Medical History:  Diagnosis Date  . Anemia   . Asthma   . Migraine     History reviewed. No pertinent surgical history.  Family History  Problem Relation Age of Onset  . Stroke Mother   . Diabetes Father     Social History Social History   Tobacco Use  . Smoking status: Never Smoker  . Smokeless tobacco: Never Used  Substance Use Topics  . Alcohol use: Yes    Comment: rarely  . Drug use: Yes    Types: Marijuana    Comment: last smoked x1 month    No Known Allergies  Current Outpatient Medications  Medication Sig Dispense Refill  . aspirin-acetaminophen-caffeine (EXCEDRIN MIGRAINE) 250-250-65 MG per tablet Take 2 tablets by mouth every 6 (six) hours as needed for pain.    Marland Kitchen ibuprofen (ADVIL,MOTRIN) 800 MG tablet Take 1 tablet (800 mg total) by mouth 3 (three) times daily. 30 tablet 0  . Aspirin-Acetaminophen-Caffeine (GOODYS EXTRA STRENGTH) 500-325-65 MG PACK Take 1 Package by mouth daily as needed (pain).     No current facility-administered medications for this visit.     Review of Systems Review of Systems  Constitutional: Negative.   Respiratory: Negative.   Cardiovascular: Negative.   Genitourinary: Positive for vaginal pain (minimal discomfort at the left vulva).    Blood pressure 121/71, pulse 67, height 5\' 6"  (1.676 m), weight 163 lb 4.8 oz (74.1 kg), last menstrual period 07/03/2019.  Physical Exam Physical Exam Vitals signs and nursing note reviewed.  Constitutional:    Appearance: Normal appearance.  Pulmonary:     Effort: Pulmonary effort is normal.  Abdominal:     General: Abdomen is flat.  Genitourinary:    Comments: Word catheter in place right vulva at introitus, no swelling or tenderness, erythema Neurological:     Mental Status: She is alert.     Data Reviewed ED note 10/3 reviewed  Assessment S/P I&D Bartholin's gland abscess doing well  Plan The site may heal better if the catheter is maintained longer. As she is tolerating this she should return in 2 weeks for removal. She agrees to the plan  20 min face to face and coordination of care.  Emeterio Reeve 07/19/2019, 1:43 PM

## 2019-07-28 ENCOUNTER — Telehealth: Payer: Self-pay

## 2019-07-28 NOTE — Telephone Encounter (Signed)
Returned call to patient, left VM message to call office

## 2019-08-04 ENCOUNTER — Other Ambulatory Visit: Payer: Self-pay

## 2019-08-04 ENCOUNTER — Other Ambulatory Visit (HOSPITAL_COMMUNITY)
Admission: RE | Admit: 2019-08-04 | Discharge: 2019-08-04 | Disposition: A | Discharge status: Home / Self Care | Payer: Medicaid - Out of State | Source: Ambulatory Visit | Attending: Obstetrics & Gynecology | Admitting: Obstetrics & Gynecology

## 2019-08-04 ENCOUNTER — Encounter: Payer: Self-pay | Admitting: Obstetrics & Gynecology

## 2019-08-04 ENCOUNTER — Ambulatory Visit: Payer: Medicaid - Out of State | Admitting: Obstetrics & Gynecology

## 2019-08-04 VITALS — BP 118/69 | HR 66 | Wt 161.9 lb

## 2019-08-04 DIAGNOSIS — Z0000 Encounter for general adult medical examination without abnormal findings: Secondary | ICD-10-CM

## 2019-08-04 DIAGNOSIS — Z01419 Encounter for gynecological examination (general) (routine) without abnormal findings: Secondary | ICD-10-CM

## 2019-08-04 DIAGNOSIS — N751 Abscess of Bartholin's gland: Secondary | ICD-10-CM

## 2019-08-04 DIAGNOSIS — Z23 Encounter for immunization: Secondary | ICD-10-CM | POA: Diagnosis not present

## 2019-08-04 DIAGNOSIS — A5901 Trichomonal vulvovaginitis: Secondary | ICD-10-CM

## 2019-08-04 NOTE — Patient Instructions (Signed)
Preventive Care 21-30 Years Old, Female Preventive care refers to visits with your health care provider and lifestyle choices that can promote health and wellness. This includes:  A yearly physical exam. This may also be called an annual well check.  Regular dental visits and eye exams.  Immunizations.  Screening for certain conditions.  Healthy lifestyle choices, such as eating a healthy diet, getting regular exercise, not using drugs or products that contain nicotine and tobacco, and limiting alcohol use. What can I expect for my preventive care visit? Physical exam Your health care provider will check your:  Height and weight. This may be used to calculate body mass index (BMI), which tells if you are at a healthy weight.  Heart rate and blood pressure.  Skin for abnormal spots. Counseling Your health care provider may ask you questions about your:  Alcohol, tobacco, and drug use.  Emotional well-being.  Home and relationship well-being.  Sexual activity.  Eating habits.  Work and work environment.  Method of birth control.  Menstrual cycle.  Pregnancy history. What immunizations do I need?  Influenza (flu) vaccine  This is recommended every year. Tetanus, diphtheria, and pertussis (Tdap) vaccine  You may need a Td booster every 10 years. Varicella (chickenpox) vaccine  You may need this if you have not been vaccinated. Human papillomavirus (HPV) vaccine  If recommended by your health care provider, you may need three doses over 6 months. Measles, mumps, and rubella (MMR) vaccine  You may need at least one dose of MMR. You may also need a second dose. Meningococcal conjugate (MenACWY) vaccine  One dose is recommended if you are age 19-21 years and a first-year college student living in a residence hall, or if you have one of several medical conditions. You may also need additional booster doses. Pneumococcal conjugate (PCV13) vaccine  You may need  this if you have certain conditions and were not previously vaccinated. Pneumococcal polysaccharide (PPSV23) vaccine  You may need one or two doses if you smoke cigarettes or if you have certain conditions. Hepatitis A vaccine  You may need this if you have certain conditions or if you travel or work in places where you may be exposed to hepatitis A. Hepatitis B vaccine  You may need this if you have certain conditions or if you travel or work in places where you may be exposed to hepatitis B. Haemophilus influenzae type b (Hib) vaccine  You may need this if you have certain conditions. You may receive vaccines as individual doses or as more than one vaccine together in one shot (combination vaccines). Talk with your health care provider about the risks and benefits of combination vaccines. What tests do I need?  Blood tests  Lipid and cholesterol levels. These may be checked every 5 years starting at age 20.  Hepatitis C test.  Hepatitis B test. Screening  Diabetes screening. This is done by checking your blood sugar (glucose) after you have not eaten for a while (fasting).  Sexually transmitted disease (STD) testing.  BRCA-related cancer screening. This may be done if you have a family history of breast, ovarian, tubal, or peritoneal cancers.  Pelvic exam and Pap test. This may be done every 3 years starting at age 21. Starting at age 30, this may be done every 5 years if you have a Pap test in combination with an HPV test. Talk with your health care provider about your test results, treatment options, and if necessary, the need for more tests.   Follow these instructions at home: Eating and drinking   Eat a diet that includes fresh fruits and vegetables, whole grains, lean protein, and low-fat dairy.  Take vitamin and mineral supplements as recommended by your health care provider.  Do not drink alcohol if: ? Your health care provider tells you not to drink. ? You are  pregnant, may be pregnant, or are planning to become pregnant.  If you drink alcohol: ? Limit how much you have to 0-1 drink a day. ? Be aware of how much alcohol is in your drink. In the [place] one drink equals one 12 oz bottle of beer (355 mL), one 5 oz glass of wine (148 mL), or one 1 oz glass of hard liquor (44 mL). Lifestyle  Take daily care of your teeth and gums.  Stay active. Exercise for at least 30 minutes on 5 or more days each week.  Do not use any products that contain nicotine or tobacco, such as cigarettes, e-cigarettes, and chewing tobacco. If you need help quitting, ask your health care provider.  If you are sexually active, practice safe sex. Use a condom or other form of birth control (contraception) in order to prevent pregnancy and STIs (sexually transmitted infections). If you plan to become pregnant, see your health care provider for a preconception visit. What's next?  Visit your health care provider once a year for a well check visit.  Ask your health care provider how often you should have your eyes and teeth checked.  Stay up to date on all vaccines. This information is not intended to replace advice given to you by your health care provider. Make sure you discuss any questions you have with your health care provider. Document Released: 11/05/2001 Document Revised: 05/21/2018 Document Reviewed: 05/21/2018 Elsevier Patient Education  2020 Elsevier Inc.  

## 2019-08-04 NOTE — Progress Notes (Signed)
Pt is here for follow up from having a Word Catheter to treat Bartholin gland cyst. Pt reports that the catheter fell out on its own last Thursday. Pt reports she is doing well and feels she is healing appropriately.

## 2019-08-04 NOTE — Progress Notes (Signed)
GYNECOLOGY ANNUAL PREVENTATIVE CARE ENCOUNTER NOTE  History:     Mindy Rios is a 28 y.o. G41P0010 female here for follow up of Bartholin's cyst and a routine annual gynecologic exam.  Current complaints: none. Reports that the catheter fell out on its own.  Denies abnormal vaginal bleeding, discharge, pelvic pain, problems with intercourse or other gynecologic concerns.    Gynecologic History Patient's last menstrual period was 08/04/2019 (exact date). Contraception: none. Declines any. Last Pap: 2011. Results were: normal  Obstetric History OB History  Gravida Para Term Preterm AB Living  1       1    SAB TAB Ectopic Multiple Live Births  1            # Outcome Date GA Lbr Len/2nd Weight Sex Delivery Anes PTL Lv  1 SAB 12/01/09            Past Medical History:  Diagnosis Date  . Anemia   . Asthma   . Migraine     History reviewed. No pertinent surgical history.  Current Outpatient Medications on File Prior to Visit  Medication Sig Dispense Refill  . aspirin-acetaminophen-caffeine (EXCEDRIN MIGRAINE) 250-250-65 MG per tablet Take 2 tablets by mouth every 6 (six) hours as needed for pain.    . Aspirin-Acetaminophen-Caffeine (GOODYS EXTRA STRENGTH) [phone] MG PACK Take 1 Package by mouth daily as needed (pain).    Marland Kitchen ibuprofen (ADVIL,MOTRIN) 800 MG tablet Take 1 tablet (800 mg total) by mouth 3 (three) times daily. (Patient not taking: Reported on 08/04/2019) 30 tablet 0   No current facility-administered medications on file prior to visit.     No Known Allergies  Social History:  reports that she has never smoked. She has never used smokeless tobacco. She reports current alcohol use. She reports current drug use. Drug: Marijuana.  Family History  Problem Relation Age of Onset  . Stroke Mother   . Diabetes Father     The following portions of the patient's history were reviewed and updated as appropriate: allergies, current medications, past family history,  past medical history, past social history, past surgical history and problem list.  Review of Systems Pertinent items noted in HPI and remainder of comprehensive ROS otherwise negative.  Physical Exam:  BP 118/69   Pulse 66   Wt 161 lb 14.4 oz (73.4 kg)   LMP 08/04/2019 (Exact Date)   BMI 26.13 kg/m  CONSTITUTIONAL: Well-developed, well-nourished female in no acute distress.  HENT:  Normocephalic, atraumatic, External right and left ear normal. Oropharynx is clear and moist EYES: Conjunctivae and EOM are normal. Pupils are equal, round, and reactive to light. No scleral icterus.  NECK: Normal range of motion, supple, no masses.  Normal thyroid.  SKIN: Skin is warm and dry. No rash noted. Not diaphoretic. No erythema. No pallor. MUSCULOSKELETAL: Normal range of motion. No tenderness.  No cyanosis, clubbing, or edema.  2+ distal pulses. NEUROLOGIC: Alert and oriented to person, place, and time. Normal reflexes, muscle tone coordination.  PSYCHIATRIC: Normal mood and affect. Normal behavior. Normal judgment and thought content. CARDIOVASCULAR: Normal heart rate noted, regular rhythm RESPIRATORY: Clear to auscultation bilaterally. Effort and breath sounds normal, no problems with respiration noted. BREASTS: Symmetric in size. No masses, tenderness, skin changes, nipple drainage, or lymphadenopathy bilaterally. ABDOMEN: Soft, no distention noted.  No tenderness, rebound or guarding.  PELVIC: Normal appearing external genitalia and urethral meatus; well healed Word catheter site over the R Bartholin's gland area.  Normal appearing  vaginal mucosa and cervix.  Bloody discharge noted, cleared off with scopettes.  Pap smear obtained.  Normal uterine size, no other palpable masses, no uterine or adnexal tenderness.   Assessment and Plan:    1. Abscess of Bartholin's gland Resolved. Will continue to monitor for recurrence  2. Well woman exam with routine gynecological exam - PAP cytology sample.  Was bloody, concerned about adequacy of sample.  - Hepatitis B surface antigen - Hepatitis C antibody - HIV antibody - Cervicovaginal ancillary only - RPR - Flu vaccine administered. Will follow up results of pap smear, annual STI screen and manage accordingly. Safe sex practices with 100% condom usage recommended, this could also help with contraception given that she declines other methods. Also talked about natural family planning/fertility period awareness using apps etc.  Routine preventative health maintenance measures emphasized. Please refer to After Visit Summary for other counseling recommendations.      Verita Schneiders, MD, [place] [facility] [employer] [facility] [employer], [place]

## 2019-08-05 LAB — HEPATITIS C ANTIBODY: Hep C Virus Ab: <0.1 s/co ratio (ref 0.0–0.9)

## 2019-08-05 LAB — CERVICOVAGINAL ANCILLARY ONLY
Chlamydia: NEGATIVE
Comment: NEGATIVE
Comment: NEGATIVE
Comment: NORMAL
Neisseria Gonorrhea: POSITIVE — AB
Trichomonas: POSITIVE — AB

## 2019-08-05 LAB — HEPATITIS B SURFACE ANTIGEN: Hepatitis B Surface Ag: NEGATIVE

## 2019-08-05 LAB — HIV ANTIBODY (ROUTINE TESTING W REFLEX): HIV Screen 4th Generation wRfx: NONREACTIVE

## 2019-08-05 LAB — RPR: RPR Ser Ql: NONREACTIVE

## 2019-08-06 MED ORDER — METRONIDAZOLE 500 MG PO TABS: 500.0000 mg | ORAL_TABLET | Freq: Two times a day (BID) | ORAL | 1 refills | Status: DC

## 2019-08-06 NOTE — Addendum Note (Signed)
Addended by: Verita Schneiders A on: 08/06/2019 12:23 PM   Modules accepted: Orders

## 2019-08-06 NOTE — Progress Notes (Signed)
Patient has gonorrhea and trichomonal vaginitis. Negative testing for other STIs. She was called with results, told sheneeds to let partner(s) know so the partner(s) can get testing and treatment. Patient and sex partner(s) should abstain from unprotected sexual activity for two weeks after everyone receives appropriate treatment.  Metronidazole was prescribed for patient for trichomonas, needs to come in on Monday 08/09/19 for Ceftriaxone and Azithromycin for treatment of gonorrhea.  Patient will need to return in about 4 weeks after treatment for repeat test of cure.    Please change scheduled appointment on 08/09/19 to RN visit, does not need annual exam with Dr. Elly Modena.   Verita Schneiders, MD

## 2019-08-09 ENCOUNTER — Ambulatory Visit: Payer: Medicaid - Out of State | Admitting: Obstetrics and Gynecology

## 2019-08-09 NOTE — Progress Notes (Signed)
This is a [facility] pt I will forward it to them

## 2019-08-10 LAB — CYTOLOGY - PAP
Comment: NEGATIVE
Comment: NEGATIVE
Comment: NEGATIVE
Diagnosis: UNDETERMINED — AB
HPV 16: POSITIVE — AB
HPV 18 / 45: NEGATIVE
High risk HPV: POSITIVE — AB

## 2019-08-11 ENCOUNTER — Telehealth: Payer: Self-pay | Admitting: *Deleted

## 2019-08-11 NOTE — Telephone Encounter (Signed)
-----   Message from Osborne Oman, MD sent at 08/11/2019  1:03 PM EST ----- Please make sure patient has been treated for Gonorrhea  Also has abnormal pap and needs colposcopy  Thank you!

## 2019-08-11 NOTE — Telephone Encounter (Signed)
Telephone call to patient.  Confirmed she was treated on 08/10/19 at [facility] for her [place].    Discussed PAP result and colposcopy scheduled.

## 2019-08-11 NOTE — Progress Notes (Signed)
I don't see that this patient came to get her treatment on Monday, I called and left her a voicemail to schedule an appt to come in for treatment.

## 2019-09-09 ENCOUNTER — Encounter: Payer: Medicaid - Out of State | Admitting: Obstetrics & Gynecology

## 2019-10-04 ENCOUNTER — Other Ambulatory Visit: Payer: Self-pay

## 2019-10-04 ENCOUNTER — Ambulatory Visit: Payer: Medicaid - Out of State | Admitting: Obstetrics and Gynecology

## 2019-10-04 ENCOUNTER — Encounter: Payer: Self-pay | Admitting: Obstetrics and Gynecology

## 2019-10-04 ENCOUNTER — Other Ambulatory Visit (HOSPITAL_COMMUNITY)
Admission: RE | Admit: 2019-10-04 | Discharge: 2019-10-04 | Disposition: A | Discharge status: Home / Self Care | Payer: Medicaid - Out of State | Source: Ambulatory Visit | Attending: Obstetrics and Gynecology | Admitting: Obstetrics and Gynecology

## 2019-10-04 DIAGNOSIS — N870 Mild cervical dysplasia: Secondary | ICD-10-CM

## 2019-10-04 DIAGNOSIS — R87610 Atypical squamous cells of undetermined significance on cytologic smear of cervix (ASC-US): Secondary | ICD-10-CM

## 2019-10-04 DIAGNOSIS — R87810 Cervical high risk human papillomavirus (HPV) DNA test positive: Secondary | ICD-10-CM | POA: Diagnosis present

## 2019-10-04 DIAGNOSIS — Z3202 Encounter for pregnancy test, result negative: Secondary | ICD-10-CM | POA: Diagnosis not present

## 2019-10-04 LAB — POCT URINE PREGNANCY: Preg Test, Ur: NEGATIVE

## 2019-10-04 NOTE — Progress Notes (Signed)
Patient with ASCUS +HPV pap smear 08/04/2019 here for colposcopy Patient given informed consent, signed copy in the chart, time out was performed.  Placed in lithotomy position. Cervix viewed with speculum and colposcope after application of acetic acid.   Colposcopy adequate?  yes Acetowhite lesions? 5 o'clock Punctation? no Mosaicism?  no Abnormal vasculature?  no Biopsies? Yes 5 o'clock ECC? yes  COMMENTS:  Patient was given post procedure instructions.  She will return in 2 weeks for results.  Catalina Antigua, MD

## 2019-10-04 NOTE — Addendum Note (Signed)
Addended by: Cheree Ditto, Mayumi Summerson A on: 10/04/2019 11:41 AM   Modules accepted: Orders

## 2019-10-05 LAB — SURGICAL PATHOLOGY

## 2022-11-19 ENCOUNTER — Other Ambulatory Visit: Payer: Self-pay

## 2022-11-19 ENCOUNTER — Encounter (HOSPITAL_COMMUNITY): Payer: Self-pay | Admitting: *Deleted

## 2022-11-19 ENCOUNTER — Ambulatory Visit (HOSPITAL_COMMUNITY)
Admission: EM | Admit: 2022-11-19 | Discharge: 2022-11-19 | Disposition: A | Discharge status: Home / Self Care | Payer: BC Managed Care – PPO | Attending: Internal Medicine | Admitting: Internal Medicine

## 2022-11-19 DIAGNOSIS — M25512 Pain in left shoulder: Secondary | ICD-10-CM | POA: Diagnosis not present

## 2022-11-19 MED ORDER — ACETAMINOPHEN 325 MG PO TABS
ORAL_TABLET | ORAL | Status: AC
  Filled 2022-11-19: qty 3

## 2022-11-19 MED ORDER — KETOROLAC TROMETHAMINE 30 MG/ML IJ SOLN
30.0000 mg | Freq: Once | INTRAMUSCULAR | Status: AC
  Administered 2022-11-19: 30 mg via INTRAMUSCULAR

## 2022-11-19 MED ORDER — KETOROLAC TROMETHAMINE 30 MG/ML IJ SOLN
INTRAMUSCULAR | Status: AC
  Filled 2022-11-19: qty 1

## 2022-11-19 MED ORDER — ACETAMINOPHEN 325 MG PO TABS
975.0000 mg | ORAL_TABLET | Freq: Once | ORAL | Status: AC
  Administered 2022-11-19: 975 mg via ORAL

## 2022-11-19 MED ORDER — METHOCARBAMOL 500 MG PO TABS: 500.0000 mg | ORAL_TABLET | Freq: Two times a day (BID) | ORAL | 0 refills | Status: DC

## 2022-11-19 MED ORDER — NAPROXEN 500 MG PO TABS: 500.0000 mg | ORAL_TABLET | Freq: Two times a day (BID) | ORAL | 0 refills | Status: DC

## 2022-11-19 NOTE — Discharge Instructions (Addendum)
Your pain is likely due to a muscle strain which will improve on its own with time.   - Take naproxen '500mg'$  with food every 12 hours as needed for pain and inflammation. Do not take any other NSAID containing medicine when taking naproxen.   - You may start taking naproxen tomorrow since you were given a dose of ketorolac (toradol) injection in clinic today for pain and inflammation. - You may also take the prescribed muscle relaxer as directed as needed for muscle aches/spasm.  Do not take this medication and drive or drink alcohol as it can make you sleepy.  Mainly use this medicine at nighttime as needed. - Apply heat 20 minutes on then 20 minutes off and perform gentle range of motion exercises to the area of greatest pain to prevent muscle stiffness and provide further pain relief.   Red flag symptoms to watch out for are numbness/tingling to the legs, weakness, loss of bowel/bladder control, and/or worsening pain that does not respond well to medicines. Follow-up with your primary care provider or return to urgent care if your symptoms do not improve in the next 3 to 4 days with medications and interventions recommended today. If your symptoms are severe (red flag), please go to the emergency room.  I hope you feel better!

## 2022-11-19 NOTE — ED Provider Notes (Signed)
[place]    CSN: [id number] Arrival date & time: 11/19/22  1535      History   Chief Complaint Chief Complaint  Patient presents with   Shoulder Pain    HPI Mindy Rios is a 32 y.o. female.   Patient presents to urgent care for evaluation of left shoulder pain that started approximately 2 weeks ago and has worsened significantly since onset.  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 She believes this has caused her shoulder discomfort. Denies recent or past trauma or injury to the left shoulder.  No numbness or tingling distal to injury.  She has limited range of motion to the left shoulder joint with active movement of the left upper extremity due to pain.  Denies chest pain, shortness of breath, cough, and fever/chills.  Denies radicular pain down the left arm.  She has been using Tylenol and ibuprofen intermittently for pain.  States pain improved significantly after using ibuprofen 2 days ago.  She has not had any medications for symptoms in the last 2 days.    Shoulder Pain   Past Medical History:  Diagnosis Date   Anemia    Asthma    Migraine     Patient Active Problem List   Diagnosis Date Noted   ASCUS with positive high risk HPV cervical 10/04/2019   Abscess of Bartholin's gland 07/19/2019   Migraine     History reviewed. No pertinent surgical history.  OB History     Gravida  1   Para      Term      Preterm      AB  1   Living         SAB  1   IAB      Ectopic      Multiple      Live Births               Home Medications    Prior to Admission medications   Medication Sig Start Date End Date Taking? Authorizing Provider  methocarbamol (ROBAXIN) 500 MG tablet Take 1 tablet (500 mg total) by mouth 2 (two) times daily. 11/19/22  Yes Talbot Grumbling, FNP  aspirin-acetaminophen-caffeine (EXCEDRIN MIGRAINE) [phone] MG per tablet Take 2 tablets by mouth every 6  (six) hours as needed for pain.    [provider]  Aspirin-Acetaminophen-Caffeine (GOODYS EXTRA STRENGTH) [phone] MG PACK Take 1 Package by mouth daily as needed (pain).    [provider]  ibuprofen (ADVIL,MOTRIN) 800 MG tablet Take 1 tablet (800 mg total) by mouth 3 (three) times daily. Patient not taking: Reported on 08/04/2019 03/22/18   Wieters, Hallie C, PA-C  metroNIDAZOLE (FLAGYL) 500 MG tablet Take 1 tablet (500 mg total) by mouth 2 (two) times daily. Patient not taking: Reported on 10/04/2019 08/06/19   Osborne Oman, MD    Family History Family History  Problem Relation Age of Onset   Stroke Mother    Diabetes Father     Social History Social History   Tobacco Use   Smoking status: Never   Smokeless tobacco: Never  Vaping Use   Vaping Use: Never used  Substance Use Topics   Alcohol use: Yes    Comment: rarely   Drug use: Yes    Types: Marijuana    Comment: last smoked x1 month     Allergies   Patient has no known allergies.  Review of Systems Review of Systems Per HPI  Physical Exam Triage Vital Signs ED Triage Vitals  Enc Vitals Group     BP 11/19/22 1615 107/68     Pulse Rate 11/19/22 1615 64     Resp 11/19/22 1615 18     Temp 11/19/22 1615 97.9 F (36.6 C)     Temp src --      SpO2 11/19/22 1615 93 %     Weight --      Height --      Head Circumference --      Peak Flow --      Pain Score 11/19/22 1613 6     Pain Loc --      Pain Edu? --      Excl. in [place]? --    No data found.  Updated Vital Signs BP 107/68   Pulse 64   Temp 97.9 F (36.6 C)   Resp 18   LMP 11/17/2022   SpO2 93%   Visual Acuity Right Eye Distance:   Left Eye Distance:   Bilateral Distance:    Right Eye Near:   Left Eye Near:    Bilateral Near:     Physical Exam Vitals and nursing note reviewed.  Constitutional:      Appearance: She is not ill-appearing or toxic-appearing.  HENT:     Head: Normocephalic and atraumatic.     Right  Ear: Hearing and external ear normal.     Left Ear: Hearing and external ear normal.     Nose: Nose normal.     Mouth/Throat:     Lips: Pink.  Eyes:     General: Lids are normal. Vision grossly intact. Gaze aligned appropriately.     Extraocular Movements: Extraocular movements intact.     Conjunctiva/sclera: Conjunctivae normal.  Cardiovascular:     Rate and Rhythm: Normal rate and regular rhythm.     Heart sounds: Normal heart sounds, S1 normal and S2 normal.  Pulmonary:     Effort: Pulmonary effort is normal. No respiratory distress.     Breath sounds: Normal breath sounds and air entry.  Musculoskeletal:     Right shoulder: Normal.     Left shoulder: Tenderness and bony tenderness present. No swelling, deformity, effusion, laceration or crepitus. Decreased range of motion. Normal strength. Normal pulse.     Right upper arm: Normal.     Left upper arm: Normal.     Cervical back: Normal and neck supple.     Thoracic back: Normal.     Lumbar back: Normal.     Comments: Decreased ROM to the left shoulder due to significant tenderness to the left shoulder joint. TTP to the generalized left shoulder joint. +2 left radial pulse, 5/5 strength to bilateral upper extremities.   Skin:    General: Skin is warm and dry.     Capillary Refill: Capillary refill takes less than 2 seconds.     Findings: No rash.  Neurological:     General: No focal deficit present.     Mental Status: She is alert and oriented to person, place, and time. Mental status is at baseline.     Cranial Nerves: No dysarthria or facial asymmetry.  Psychiatric:        Mood and Affect: Mood normal.        Speech: Speech normal.        Behavior: Behavior normal.        Thought Content: Thought content normal.  Judgment: Judgment normal.      UC Treatments / Results  Labs (all labs ordered are listed, but only abnormal results are displayed) Labs Reviewed - No data to display  EKG   Radiology No results  found.  Procedures Procedures (including critical care time)  Medications Ordered in UC Medications  ketorolac (TORADOL) 30 MG/ML injection 30 mg (has no administration in time range)  acetaminophen (TYLENOL) tablet 975 mg (has no administration in time range)    Initial Impression / Assessment and Plan / UC Course  I have reviewed the triage vital signs and the nursing notes.  Pertinent labs & imaging results that were available during my care of the patient were reviewed by me and considered in my medical decision making (see chart for details).   1.  Acute pain of the left shoulder Presentation is consistent with acute strain of left shoulder and likely tendinopathy secondary to overuse due to patient's frequent heavy lifting at her job. Reassuring that symptoms improved with NSAID therapy prior to arrival at urgent care a couple of days ago. Ketorolac '30mg'$  IM provided in clinic for acute pain. Naproxen twice daily for 2-3 days to be started tomorrow, then to be used as needed for shoulder pain. No NSAID until tomorrow due to ketorolac injection today. Walking referral to [facility] provided if symptoms fail to improve. Deferred imaging of the left shoulder today due to stable musculoskeletal exam findings, patient is neurovascularly intact distal to injury, and lack of trauma to the left shoulder. She is agreeable with this plan.   Discussed physical exam and available lab work findings in clinic with patient.  Counseled patient regarding appropriate use of medications and potential side effects for all medications recommended or prescribed today. Discussed red flag signs and symptoms of worsening condition,when to call the PCP office, return to urgent care, and when to seek higher level of care in the emergency department. Patient verbalizes understanding and agreement with plan. All questions answered. Patient discharged in stable condition.    Final Clinical Impressions(s) / UC  Diagnoses   Final diagnoses:  Acute pain of left shoulder     Discharge Instructions      Your pain is likely due to a muscle strain which will improve on its own with time.   - Take naproxen '500mg'$  with food every 12 hours as needed for pain and inflammation. Do not take any other NSAID containing medicine when taking naproxen.   - You may start taking naproxen tomorrow since you were given a dose of ketorolac (toradol) injection in clinic today for pain and inflammation. - You may also take the prescribed muscle relaxer as directed as needed for muscle aches/spasm.  Do not take this medication and drive or drink alcohol as it can make you sleepy.  Mainly use this medicine at nighttime as needed. - Apply heat 20 minutes on then 20 minutes off and perform gentle range of motion exercises to the area of greatest pain to prevent muscle stiffness and provide further pain relief.   Red flag symptoms to watch out for are numbness/tingling to the legs, weakness, loss of bowel/bladder control, and/or worsening pain that does not respond well to medicines. Follow-up with your primary care provider or return to urgent care if your symptoms do not improve in the next 3 to 4 days with medications and interventions recommended today. If your symptoms are severe (red flag), please go to the emergency room.  I hope you feel better!  ED Prescriptions     Medication Sig Dispense Auth. Provider   methocarbamol (ROBAXIN) 500 MG tablet Take 1 tablet (500 mg total) by mouth 2 (two) times daily. 20 tablet Talbot Grumbling, FNP      PDMP not reviewed this encounter.   Talbot Grumbling, [place] 11/19/22 1718

## 2022-11-19 NOTE — ED Triage Notes (Signed)
Pt reports Lt shoulder pain the patient lifts heavy objects at work and thinks she injured shoulder at work.

## 2022-11-28 ENCOUNTER — Other Ambulatory Visit: Payer: Self-pay

## 2022-11-28 ENCOUNTER — Encounter (HOSPITAL_COMMUNITY): Payer: Self-pay

## 2022-11-28 ENCOUNTER — Emergency Department (HOSPITAL_COMMUNITY)
Admission: EM | Admit: 2022-11-28 | Discharge: 2022-11-28 | Disposition: A | Discharge status: Home / Self Care | Payer: BC Managed Care – PPO | Attending: Emergency Medicine | Admitting: Emergency Medicine

## 2022-11-28 DIAGNOSIS — N750 Cyst of Bartholin's gland: Secondary | ICD-10-CM | POA: Diagnosis not present

## 2022-11-28 DIAGNOSIS — N7689 Other specified inflammation of vagina and vulva: Secondary | ICD-10-CM | POA: Diagnosis present

## 2022-11-28 HISTORY — DX: Cyst of Bartholin's gland: N75.0

## 2022-11-28 MED ORDER — ACETAMINOPHEN 500 MG PO TABS
1000.0000 mg | ORAL_TABLET | Freq: Once | ORAL | Status: AC
  Administered 2022-11-28: 1000 mg via ORAL
  Filled 2022-11-28: qty 2

## 2022-11-28 MED ORDER — LIDOCAINE HCL URETHRAL/MUCOSAL 2 % EX GEL
1.0000 | Freq: Once | CUTANEOUS | Status: AC
  Administered 2022-11-28: 1 via TOPICAL
  Filled 2022-11-28: qty 11

## 2022-11-28 MED ORDER — CEPHALEXIN 500 MG PO CAPS
500.0000 mg | ORAL_CAPSULE | Freq: Once | ORAL | Status: AC
  Administered 2022-11-28: 500 mg via ORAL
  Filled 2022-11-28: qty 1

## 2022-11-28 MED ORDER — CEPHALEXIN 500 MG PO CAPS: 500.0000 mg | ORAL_CAPSULE | Freq: Two times a day (BID) | ORAL | 0 refills | Status: AC

## 2022-11-28 NOTE — ED Triage Notes (Signed)
Bartholin cyst on left side of vaginal area for 3 days. Pt has hx of same. Denies fever, chills, body aches.

## 2022-11-28 NOTE — ED Provider Notes (Signed)
[place] Provider Note   CSN: [id number] Arrival date & time: 11/28/22  2224     History Chief Complaint  Patient presents with   Cyst    HPI Mindy Rios is a 32 y.o. female presenting for chief complaint of vaginal swelling.  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  States that her current one started approximately 3 days ago.  States that last year she waited for months before coming in and got so large she had to wear a Ward catheter for months.  States that she was trying to preempt such severe presentation.  Denies fevers chills nausea vomiting syncope shortness of breath.  Denies any overlying redness or abnormality.  No known sick contacts.  Otherwise ambulatory tolerating p.o. intake..   Patient's recorded medical, surgical, social, medication list and allergies were reviewed in the Snapshot window as part of the initial history.   Review of Systems   Review of Systems  Constitutional:  Negative for chills and fever.  HENT:  Negative for ear pain and sore throat.   Eyes:  Negative for pain and visual disturbance.  Respiratory:  Negative for cough and shortness of breath.   Cardiovascular:  Negative for chest pain and palpitations.  Gastrointestinal:  Negative for abdominal pain and vomiting.  Genitourinary:  Negative for dysuria and hematuria.  Musculoskeletal:  Negative for arthralgias and back pain.  Skin:  Negative for color change and rash.  Neurological:  Negative for seizures and syncope.  All other systems reviewed and are negative.   Physical Exam Updated Vital Signs BP (!) 140/73   Pulse 79   Temp 99.1 F (37.3 C) (Oral)   Resp 18   Ht '5\' 5"'$  (1.651 m)   Wt 75.8 kg   LMP 11/17/2022   SpO2 96%   BMI 27.79 kg/m  Physical Exam Vitals and nursing note reviewed.  Constitutional:      General: She is not in acute distress.    Appearance: She is well-developed.  HENT:      Head: Normocephalic and atraumatic.  Eyes:     Conjunctiva/sclera: Conjunctivae normal.  Cardiovascular:     Rate and Rhythm: Normal rate and regular rhythm.     Heart sounds: No murmur heard. Pulmonary:     Effort: Pulmonary effort is normal. No respiratory distress.     Breath sounds: Normal breath sounds.  Abdominal:     General: There is no distension.     Palpations: Abdomen is soft.     Tenderness: There is no abdominal tenderness. There is no right CVA tenderness or left CVA tenderness.  Musculoskeletal:        General: No swelling or tenderness. Normal range of motion.     Cervical back: Neck supple.  Skin:    General: Skin is warm and dry.  Neurological:     General: No focal deficit present.     Mental Status: She is alert and oriented to person, place, and time. Mental status is at baseline.     Cranial Nerves: No cranial nerve deficit.      ED Course/ Medical Decision Making/ A&P    Procedures Ultrasound ED Soft Tissue  Date/Time: 11/28/2022 11:17 PM  Performed by: Tretha Sciara, MD Authorized by: Tretha Sciara, MD   Procedure details:    Indications: localization of abscess     Images: archived     Limitations:  Body habitus Location:  Location: perineum   Findings:     no abscess present    no cellulitis present    no foreign body present    Medications Ordered in ED Medications  cephALEXin (KEFLEX) capsule 500 mg (has no administration in time range)  acetaminophen (TYLENOL) tablet 1,000 mg (has no administration in time range)  lidocaine (XYLOCAINE) 2 % jelly 1 Application (1 Application Topical Given 11/28/22 2318)    Medical Decision Making:    Mindy Rios is a 32 y.o. female who presented to the ED today with vaginal swelling detailed above.     Patient placed on continuous vitals and telemetry monitoring while in ED which was reviewed periodically.   Complete initial physical exam performed, notably the patient was with  obvious vaginal swelling.  Ultrasound was performed that demonstrated only 1 cc of fluid collection.  She does have some tissue swelling and erythema.  Had a shared medical decision making with the patient.  Given only 1 cc of fluid identified, incision and drainage/aspiration seems unlikely to be diagnostic or beneficial for patient..  Will have patient take Keflex and follow-up with her primary obstetrician in the outpatient setting to identify need for surgical intervention. Reviewed and confirmed nursing documentation for past medical history, family history, social history.    Disposition:  I have considered need for hospitalization, however, considering all of the above, I believe this patient is stable for discharge at this time.  Patient/family educated about specific return precautions for given chief complaint and symptoms.  Patient/family educated about follow-up with PCP and GYN.     Patient/family expressed understanding of return precautions and need for follow-up. Patient spoken to regarding all imaging and laboratory results and appropriate follow up for these results. All education provided in verbal form with additional information in written form. Time was allowed for answering of patient questions. Patient discharged.    Emergency Department Medication Summary:   Medications  cephALEXin (KEFLEX) capsule 500 mg (has no administration in time range)  acetaminophen (TYLENOL) tablet 1,000 mg (has no administration in time range)  lidocaine (XYLOCAINE) 2 % jelly 1 Application (1 Application Topical Given 11/28/22 2318)        Clinical Impression:  1. Bartholin's cyst      Discharge   Final Clinical Impression(s) / ED Diagnoses Final diagnoses:  Bartholin's cyst    Rx / DC Orders ED Discharge Orders          Ordered    cephALEXin (KEFLEX) 500 MG capsule  2 times daily        11/28/22 2318              Tretha Sciara, MD 11/28/22 2319

## 2023-09-18 ENCOUNTER — Emergency Department (HOSPITAL_COMMUNITY)
Admission: EM | Admit: 2023-09-18 | Discharge: 2023-09-18 | Disposition: A | Discharge status: Home / Self Care | Payer: BC Managed Care – PPO | Attending: Emergency Medicine | Admitting: Emergency Medicine

## 2023-09-18 ENCOUNTER — Other Ambulatory Visit: Payer: Self-pay

## 2023-09-18 ENCOUNTER — Encounter (HOSPITAL_COMMUNITY): Payer: Self-pay

## 2023-09-18 DIAGNOSIS — H9202 Otalgia, left ear: Secondary | ICD-10-CM | POA: Insufficient documentation

## 2023-09-18 DIAGNOSIS — Z7982 Long term (current) use of aspirin: Secondary | ICD-10-CM | POA: Insufficient documentation

## 2023-09-18 NOTE — Discharge Instructions (Addendum)
No obvious infection on exam today.  Continue taking the doxycycline.  Take Tylenol 1000 mg every 8 hours as needed for pain.  You can also take 600 mg of ibuprofen every 6-8 hours.  Note given information for ear nose throat specialist.  If you have any concerning symptoms return to the emergency room.  Our on-call ENT is located in [place].  I have also listed information for a local ENT.  However the local ENT is not on-call and was not obligated to see you.  They can if they have availability.  If the local ENT is not able to see you you will need to follow-up with the ENT on-call ENT Dr. Ross Marcus.

## 2023-09-18 NOTE — ED Provider Notes (Signed)
[place] [facility] Provider Note   CSN: [id number] Arrival date & time: 09/18/23  4259     History  Chief Complaint  Patient presents with   Otalgia    Mindy Rios is a 32 y.o. female.  32 year old female presents today for concern of left ear pain.  Ongoing since beginning of December.  She is currently on her second antibiotic.  She also has long fingernails and scratches inside her left ear.  This is a chronic habit.  She attributes this to anxiety.  No fever, hearing loss.  No difficulty swallowing, pain with opening her mouth, sore throat.  No voice change.  Pain is intermittent.  The history is provided by the patient. No language interpreter was used.       Home Medications Prior to Admission medications   Medication Sig Start Date End Date Taking? Authorizing Provider  aspirin-acetaminophen-caffeine (EXCEDRIN MIGRAINE) [phone] MG per tablet Take 2 tablets by mouth every 6 (six) hours as needed for pain.    [provider]  Aspirin-Acetaminophen-Caffeine (GOODYS EXTRA STRENGTH) [phone] MG PACK Take 1 Package by mouth daily as needed (pain).    [provider]  methocarbamol (ROBAXIN) 500 MG tablet Take 1 tablet (500 mg total) by mouth 2 (two) times daily. 11/19/22   Carlisle Beers, FNP  metroNIDAZOLE (FLAGYL) 500 MG tablet Take 1 tablet (500 mg total) by mouth 2 (two) times daily. Patient not taking: Reported on 10/04/2019 08/06/19   Anyanwu, Jethro Bastos, MD  naproxen (NAPROSYN) 500 MG tablet Take 1 tablet (500 mg total) by mouth 2 (two) times daily. 11/19/22   Carlisle Beers, FNP      Allergies    Pineapple and Chocolate    Review of Systems   Review of Systems  Constitutional:  Negative for chills and fever.  HENT:  Positive for ear pain. Negative for drooling, ear discharge and sore throat.   Eyes:  Negative for photophobia.  Respiratory:  Negative for shortness of breath.   All other  systems reviewed and are negative.   Physical Exam Updated Vital Signs BP 108/67 (BP Location: Left Arm)   Pulse 65   Temp 98.3 F (36.8 C) (Oral)   Resp 17   Ht 5\' 5"  (1.651 m)   Wt 73.5 kg   LMP 09/02/2023 (Approximate)   SpO2 100%   BMI 26.96 kg/m  Physical Exam Vitals and nursing note reviewed.  Constitutional:      General: She is not in acute distress.    Appearance: Normal appearance. She is not ill-appearing.  HENT:     Head: Normocephalic and atraumatic.     Right Ear: Tympanic membrane, ear canal and external ear normal. There is no impacted cerumen.     Left Ear: Tympanic membrane, ear canal and external ear normal. There is no impacted cerumen.     Nose: Nose normal.  Eyes:     Conjunctiva/sclera: Conjunctivae normal.  Cardiovascular:     Rate and Rhythm: Normal rate.  Pulmonary:     Effort: Pulmonary effort is normal. No respiratory distress.  Musculoskeletal:        General: No deformity.  Skin:    Findings: No rash.  Neurological:     Mental Status: She is alert.     ED Results / Procedures / Treatments   Labs (all labs ordered are listed, but only abnormal results are displayed) Labs Reviewed - No data to display  EKG None  Radiology No results found.  Procedures Procedures    Medications Ordered in ED Medications - No data to display  ED Course/ Medical Decision Making/ A&P                                 Medical Decision Making  Exam overall reassuring.  No evidence of otitis externa, or otitis media.  No obvious perforation of the TM.  Hearing grossly intact.  No tenderness over the mandible.  No evidence of PTA, RPA, or Ludwig's angina.  No tenderness to palpation of the mastoid.  Afebrile.  Hemodynamically stable.  She could have a micro lacerations of the ear given her long and sharp fingernails that she constantly scratches with.  She states it also burns when she uses peroxide.  Advised to stop this.  There is no ear impaction.   Discharged in stable condition.  ENT information given.  Return precautions discussed.   Final Clinical Impression(s) / ED Diagnoses Final diagnoses:  Left ear pain    Rx / DC Orders ED Discharge Orders     None         Marita Kansas, PA-C 09/18/23 1036    Horton, Clabe Seal, DO 09/18/23 1552

## 2023-09-18 NOTE — ED Triage Notes (Addendum)
Pt complaining of ear pain that began on the 5th. Pt started on 2 abx without relief. Pt states improvement of pain with tylenol. Told she should be seen in person since s/s have not improved. Pt took 1000mg  of tylenol at 0700 today.

## 2024-06-30 ENCOUNTER — Emergency Department (HOSPITAL_COMMUNITY)

## 2024-06-30 ENCOUNTER — Encounter (HOSPITAL_COMMUNITY): Payer: Self-pay | Admitting: *Deleted

## 2024-06-30 ENCOUNTER — Other Ambulatory Visit: Payer: Self-pay

## 2024-06-30 ENCOUNTER — Emergency Department (HOSPITAL_COMMUNITY): Admission: EM | Admit: 2024-06-30 | Discharge: 2024-07-01 | Disposition: A | Discharge status: Home / Self Care

## 2024-06-30 DIAGNOSIS — J45909 Unspecified asthma, uncomplicated: Secondary | ICD-10-CM | POA: Insufficient documentation

## 2024-06-30 DIAGNOSIS — R0789 Other chest pain: Secondary | ICD-10-CM | POA: Diagnosis present

## 2024-06-30 DIAGNOSIS — R079 Chest pain, unspecified: Secondary | ICD-10-CM

## 2024-06-30 LAB — BASIC METABOLIC PANEL WITH GFR
Anion gap: 8 (ref 5–15)
BUN: 15 mg/dL (ref 6–20)
CO2: 24 mmol/L (ref 22–32)
Calcium: 8.9 mg/dL (ref 8.9–10.3)
Chloride: 106 mmol/L (ref 98–111)
Creatinine, Ser: 0.79 mg/dL (ref 0.44–1.00)
GFR, Estimated: >60 mL/min (ref >60)
Glucose, Bld: 92 mg/dL (ref 70–99)
Potassium: 4 mmol/L (ref 3.5–5.1)
Sodium: 138 mmol/L (ref 135–145)

## 2024-06-30 LAB — TROPONIN I (HIGH SENSITIVITY)
Troponin I (High Sensitivity): <2 ng/L (ref <18)
Troponin I (High Sensitivity): <2 ng/L (ref <18)

## 2024-06-30 LAB — CBC
HCT: 33.7 % — ABNORMAL LOW (ref 36.0–46.0)
Hemoglobin: 11.1 g/dL — ABNORMAL LOW (ref 12.0–15.0)
MCH: 30.7 pg (ref 26.0–34.0)
MCHC: 32.9 g/dL (ref 30.0–36.0)
MCV: 93.4 fL (ref 80.0–100.0)
Platelets: 276 K/uL (ref 150–400)
RBC: 3.61 MIL/uL — ABNORMAL LOW (ref 3.87–5.11)
RDW: 12.5 % (ref 11.5–15.5)
WBC: 6.1 K/uL (ref 4.0–10.5)
nRBC: 0 % (ref 0.0–0.2)

## 2024-06-30 LAB — HCG, SERUM, QUALITATIVE: Preg, Serum: NEGATIVE

## 2024-06-30 NOTE — ED Triage Notes (Signed)
 The pt has had chest pain since this am no carddiac history some sob none now  no temp lmp 10-06

## 2024-07-01 LAB — D-DIMER, QUANTITATIVE: D-Dimer, Quant: <0.27 ug{FEU}/mL (ref 0.00–0.50)

## 2024-07-01 MED ORDER — NAPROXEN 500 MG PO TABS: 500.0000 mg | ORAL_TABLET | Freq: Two times a day (BID) | ORAL | 0 refills | Status: AC

## 2024-07-01 MED ORDER — NAPROXEN 250 MG PO TABS
500.0000 mg | ORAL_TABLET | Freq: Once | ORAL | Status: AC
  Administered 2024-07-01: 500 mg via ORAL
  Filled 2024-07-01: qty 2

## 2024-07-01 NOTE — ED Provider Notes (Signed)
 [facility] AT [facility] Provider Note   CSN: [id number] Arrival date & time: 06/30/24  1901     Patient presents with: Chest Pain   Mindy Rios is a 33 y.o. female.   Mindy Rios is a 33 y.o. female with a with a history of asthma and migraines, who presents to the ED for evaluation of chest pain.  Chest pain started at 10 AM yesterday, was not brought on by exertion.  Pain is described as an intense pressure on the left side of her chest that does not radiate although occasionally she reports she feels like it goes back towards her back.  Made worse with exhalation.  No associated lightheadedness or syncope, no nausea, vomiting or abdominal pain, occasional shortness of breath when pain is more severe.  Occasionally it will get worse but nothing seems to make it better or has not completely go away.  She has not tried any medications to treat symptoms.  No personal history of heart issues, does have family history of heart failure.  Denies personal or family history of blood clots, did recently take a 6-hour car trip to and from [place].  No leg pain or swelling.  Is not on OCPs.  The history is provided by the patient.  Chest Pain Associated symptoms: no abdominal pain, no cough, no fever, no palpitations, no shortness of breath and no vomiting        Prior to Admission medications   Medication Sig Start Date End Date Taking? Authorizing Provider  aspirin-acetaminophen -caffeine (EXCEDRIN MIGRAINE) 250-250-65 MG per tablet Take 2 tablets by mouth every 6 (six) hours as needed for pain.    [provider]  Aspirin-Acetaminophen -Caffeine (GOODYS EXTRA STRENGTH) 500-325-65 MG PACK Take 1 Package by mouth daily as needed (pain).    [provider]  methocarbamol  (ROBAXIN ) 500 MG tablet Take 1 tablet (500 mg total) by mouth 2 (two) times daily. 11/19/22   Enedelia Dorna HERO, FNP  metroNIDAZOLE  (FLAGYL ) 500 MG tablet Take 1 tablet (500  mg total) by mouth 2 (two) times daily. Patient not taking: Reported on 10/04/2019 08/06/19   Anyanwu, Ugonna A, MD  naproxen  (NAPROSYN ) 500 MG tablet Take 1 tablet (500 mg total) by mouth 2 (two) times daily. 11/19/22   Enedelia Dorna HERO, FNP    Allergies: Pineapple and Chocolate    Review of Systems  Constitutional:  Negative for chills and fever.  Respiratory:  Negative for cough and shortness of breath.   Cardiovascular:  Positive for chest pain. Negative for palpitations and leg swelling.  Gastrointestinal:  Negative for abdominal pain and vomiting.    Updated Vital Signs BP 102/65 (BP Location: Right Arm)   Pulse (!) 54   Temp 98.2 F (36.8 C)   Resp 16   Ht 5' 5 (1.651 m)   Wt 73.5 kg   LMP 06/27/2024   SpO2 99%   BMI 26.96 kg/m   Physical Exam Vitals and nursing note reviewed.  Constitutional:      General: She is not in acute distress.    Appearance: Normal appearance. She is well-developed. She is not diaphoretic.  HENT:     Head: Normocephalic and atraumatic.  Eyes:     General:        Right eye: No discharge.        Left eye: No discharge.     Pupils: Pupils are equal, round, and reactive to light.  Cardiovascular:     Rate  and Rhythm: Normal rate and regular rhythm.     Pulses: Normal pulses.          Radial pulses are 2+ on the right side and 2+ on the left side.     Heart sounds: Normal heart sounds. No murmur heard.    No friction rub. No gallop.  Pulmonary:     Effort: Pulmonary effort is normal. No respiratory distress.     Breath sounds: Normal breath sounds. No wheezing or rales.     Comments: Respirations equal and unlabored, patient able to speak in full sentences, lungs clear to auscultation bilaterally  Chest:     Chest wall: Tenderness present.     Comments: Some tenderness with palpation over the left upper costochondral border without overlying skin changes or palpable deformity Abdominal:     General: Bowel sounds are normal. There  is no distension.     Palpations: Abdomen is soft. There is no mass.     Tenderness: There is no abdominal tenderness. There is no guarding.     Comments: Abdomen soft, nondistended, nontender to palpation in all quadrants without guarding or peritoneal signs  Musculoskeletal:        General: No deformity.     Cervical back: Neck supple.     Right lower leg: No tenderness. No edema.     Left lower leg: No tenderness. No edema.  Skin:    General: Skin is warm and dry.     Capillary Refill: Capillary refill takes less than 2 seconds.  Neurological:     Mental Status: She is alert and oriented to person, place, and time.     Coordination: Coordination normal.     Comments: Speech is clear, able to follow commands Moves extremities without ataxia, coordination intact  Psychiatric:        Mood and Affect: Mood normal.        Behavior: Behavior normal.     (all labs ordered are listed, but only abnormal results are displayed) Labs Reviewed  CBC - Abnormal; Notable for the following components:      Result Value   RBC 3.61 (*)    Hemoglobin 11.1 (*)    HCT 33.7 (*)    All other components within normal limits  BASIC METABOLIC PANEL WITH GFR  HCG, SERUM, QUALITATIVE  TROPONIN I (HIGH SENSITIVITY)  TROPONIN I (HIGH SENSITIVITY)    EKG: None  Radiology: DG Chest 2 View Result Date: 06/30/2024 CLINICAL DATA:  chest pain since this am no cardiac history EXAM: CHEST - 2 VIEW COMPARISON:  January 11, 2015 FINDINGS: No focal airspace consolidation, pleural effusion, or pneumothorax. No cardiomegaly.No acute fracture or destructive lesion. IMPRESSION: No acute cardiopulmonary abnormality. Electronically Signed   By: Rogelia Myers M.D.   On: 06/30/2024 19:57     Procedures   Medications Ordered in the ED - No data to display                                  Medical Decision Making Amount and/or Complexity of Data Reviewed Labs: ordered.  Risk Prescription drug  management.   Patient presents to the emergency department with chest pain. Patient nontoxic appearing, in no apparent distress, vitals without significant abnormality. Fairly benign physical exam.   DDX including but not limited to: ACS, pulmonary embolism, dissection, pneumothorax, pneumonia, arrhythmia, severe anemia, MSK, GERD, anxiety, abdominal process.   Additional history obtained:  Chart & nursing note reviewed.  Additional history obtained from Friends at bedside  EKG: Normal sinus rhythm without ischemic changes  Lab Tests:  I reviewed & interpreted labs including:  No leukocytosis, stable hemoglobin, no electrolyte derangements, normal renal function, negative pregnancy, troponin negative x 2, D-dimer negative  Imaging Studies ordered:  I ordered and viewed the following imaging, agree with radiologist impression:  Chest x-ray without active cardiopulmonary disease  ED Course:  I ordered medications including Naprosyn  for potential chest wall pain  EKG without obvious acute ischemia, delta troponin negative, doubt ACS. Patient is low risk wells, D-dimer negative, doubt pulmonary embolism. Pain is not a tearing sensation, symmetric pulses, no widening of mediastinum on CXR, doubt dissection. Cardiac monitor reviewed, no notable arrhythmias or tachycardia   Based on patient's chief complaint, I considered admission might be necessary, however after reassuring ED workup feel patient is reasonable for discharge.   I discussed results, treatment plan, need for PCP follow-up, and return precautions with the patient. Provided opportunity for questions, patient confirmed understanding and is in agreement with plan.   Portions of this note were generated with Scientist, clinical (histocompatibility and immunogenetics). Dictation errors may occur despite best attempts at proofreading.      Final diagnoses:  Left-sided chest pain    ED Discharge Orders          Ordered    naproxen  (NAPROSYN ) 500 MG tablet   2 times daily        07/01/24 0943               Alva Larraine FALCON, PA-C 07/01/24 1204    Ula Prentice SAUNDERS, MD 07/01/24 1550

## 2024-07-01 NOTE — Discharge Instructions (Addendum)
You were seen in the emergency department today for chest pain. Your work-up in the emergency department has been overall reassuring. Your labs have been fairly normal and or similar to previous blood work you have had done. Your EKG and the enzyme we use to check your heart did not show an acute heart attack at this time. Your chest x-ray was normal.   We would like you to follow up closely with your primary care provider. Return to the ER immediately should you experience any new or worsening symptoms including but not limited to return of pain, worsened pain, vomiting, shortness of breath, dizziness, lightheadedness, passing out, or any other concerns that you may have.
